# Patient Record
Sex: Male | Born: 1970 | Race: Black or African American | Hispanic: No | Marital: Married | Smoking: Never smoker
Health system: Southern US, Community
[De-identification: ages and names within clinical notes are randomized; demographics above are authoritative.]

## PROBLEM LIST (undated history)

## (undated) DIAGNOSIS — M109 Gout, unspecified: Secondary | ICD-10-CM

## (undated) DIAGNOSIS — I10 Essential (primary) hypertension: Secondary | ICD-10-CM

## (undated) DIAGNOSIS — E119 Type 2 diabetes mellitus without complications: Secondary | ICD-10-CM

## (undated) DIAGNOSIS — G4730 Sleep apnea, unspecified: Secondary | ICD-10-CM

---

## 2002-10-05 ENCOUNTER — Emergency Department (HOSPITAL_COMMUNITY): Admission: AC | Admit: 2002-10-05 | Discharge: 2002-10-06 | Payer: Self-pay

## 2002-10-05 ENCOUNTER — Encounter: Payer: Self-pay | Admitting: Emergency Medicine

## 2010-07-17 ENCOUNTER — Emergency Department (HOSPITAL_COMMUNITY)
Admission: EM | Admit: 2010-07-17 | Discharge: 2010-07-17 | Disposition: A | Discharge status: Home / Self Care | Payer: No Typology Code available for payment source | Attending: Emergency Medicine | Admitting: Emergency Medicine

## 2010-07-17 ENCOUNTER — Emergency Department (HOSPITAL_COMMUNITY): Payer: No Typology Code available for payment source

## 2010-07-17 DIAGNOSIS — IMO0002 Reserved for concepts with insufficient information to code with codable children: Secondary | ICD-10-CM | POA: Insufficient documentation

## 2015-07-06 ENCOUNTER — Emergency Department (HOSPITAL_COMMUNITY)
Admission: EM | Admit: 2015-07-06 | Discharge: 2015-07-06 | Disposition: A | Discharge status: Home / Self Care | Payer: BLUE CROSS/BLUE SHIELD | Attending: Emergency Medicine | Admitting: Emergency Medicine

## 2015-07-06 ENCOUNTER — Emergency Department (HOSPITAL_COMMUNITY): Payer: BLUE CROSS/BLUE SHIELD

## 2015-07-06 ENCOUNTER — Encounter (HOSPITAL_COMMUNITY): Payer: Self-pay | Admitting: Emergency Medicine

## 2015-07-06 DIAGNOSIS — R0789 Other chest pain: Secondary | ICD-10-CM

## 2015-07-06 DIAGNOSIS — R072 Precordial pain: Secondary | ICD-10-CM | POA: Diagnosis present

## 2015-07-06 DIAGNOSIS — Z79899 Other long term (current) drug therapy: Secondary | ICD-10-CM | POA: Diagnosis not present

## 2015-07-06 DIAGNOSIS — I10 Essential (primary) hypertension: Secondary | ICD-10-CM | POA: Diagnosis not present

## 2015-07-06 HISTORY — DX: Essential (primary) hypertension: I10

## 2015-07-06 LAB — CBC
HCT: 41.3 % (ref 39.0–52.0)
Hemoglobin: 14 g/dL (ref 13.0–17.0)
MCH: 27.8 pg (ref 26.0–34.0)
MCHC: 33.9 g/dL (ref 30.0–36.0)
MCV: 82.1 fL (ref 78.0–100.0)
Platelets: 276 K/uL (ref 150–400)
RBC: 5.03 MIL/uL (ref 4.22–5.81)
RDW: 13.6 % (ref 11.5–15.5)
WBC: 6 K/uL (ref 4.0–10.5)

## 2015-07-06 LAB — BASIC METABOLIC PANEL WITH GFR
Anion gap: 9 (ref 5–15)
BUN: 8 mg/dL (ref 6–20)
CO2: 24 mmol/L (ref 22–32)
Calcium: 9.1 mg/dL (ref 8.9–10.3)
Chloride: 104 mmol/L (ref 101–111)
Creatinine, Ser: 0.95 mg/dL (ref 0.61–1.24)
GFR calc Af Amer: >60 mL/min
GFR calc non Af Amer: >60 mL/min
Glucose, Bld: 108 mg/dL — ABNORMAL HIGH (ref 65–99)
Potassium: 3.9 mmol/L (ref 3.5–5.1)
Sodium: 137 mmol/L (ref 135–145)

## 2015-07-06 LAB — TROPONIN I: Troponin I: <0.03 ng/mL (ref <0.031)

## 2015-07-06 MED ORDER — NAPROXEN 500 MG PO TABS: 500.0000 mg | ORAL_TABLET | Freq: Two times a day (BID) | ORAL | Status: DC

## 2015-07-06 NOTE — ED Provider Notes (Signed)
CSN: [id number]     Arrival date & time 07/06/15  1039 History   First MD Initiated Contact with Patient 07/06/15 1046     Chief Complaint  Patient presents with  . Chest Pain     (Consider location/radiation/quality/duration/timing/severity/associated sxs/prior Treatment) The history is provided by the patient, the spouse and a parent.   Vallarie MareJoe A Fadden is a 45 y.o. male presenting for evaluation of midsternal and right sided chest pain which started suddenly 2 days ago when washing his car.  He describes sudden onset of a sharp, burning type pain and felt a tearing sensation as he was using his right hand to rub the car down.  His pain is triggered by movement but he also endorses 1/10 discomfort at rest.  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  He denies sob, dizziness, abdominal pain, nausea, vomiting, diaphoresis or palpitations during this time.  He is treated for htn, denies other risk factors for coronary disease including cholesterol issues (last checked at a work screening 2-3 years ago), no signficant family history, does not smoke.  He has taken no medicines for this problem prior to arrival.     Past Medical History  Diagnosis Date  . Hypertension    History reviewed. No pertinent past surgical history. History reviewed. No pertinent family history. Social History  Substance Use Topics  . Smoking status: Never Smoker   . Smokeless tobacco: None  . Alcohol Use: No    Review of Systems  Constitutional: Negative for fever and diaphoresis.  HENT: Negative for congestion and sore throat.   Eyes: Negative.   Respiratory: Negative for chest tightness, shortness of breath and wheezing.   Cardiovascular: Positive for chest pain.  Gastrointestinal: Negative for nausea, vomiting and abdominal pain.  Genitourinary: Negative.   Musculoskeletal: Negative for joint swelling, arthralgias and neck pain.    Skin: Negative.  Negative for rash and wound.  Neurological: Negative for dizziness, weakness, light-headedness, numbness and headaches.  Psychiatric/Behavioral: Negative.       Allergies  Review of patient's allergies indicates no known allergies.  Home Medications   Prior to Admission medications   Medication Sig Start Date End Date Taking? Authorizing Provider  amLODipine-olmesartan (AZOR) 10-40 MG tablet Take 1 tablet by mouth daily. 06/19/15  Yes Historical Provider, MD  cetirizine (ZYRTEC) 10 MG tablet Take 10 mg by mouth daily.   Yes Historical Provider, MD  naproxen (NAPROSYN) 500 MG tablet Take 1 tablet (500 mg total) by mouth 2 (two) times daily. 07/06/15   Burgess AmorJulie Eshaal Duby, PA-C   BP 127/81 mmHg  Pulse 74  Temp(Src) 97.9 F (36.6 C) (Oral)  Resp 21  Ht 5\' 11"  (1.803 m)  Wt 113.399 kg  BMI 34.88 kg/m2  SpO2 96% Physical Exam  Constitutional: He appears well-developed and well-nourished.  HENT:  Head: Normocephalic and atraumatic.  Eyes: Conjunctivae are normal.  Neck: Normal range of motion.  Cardiovascular: Normal rate, regular rhythm, normal heart sounds and intact distal pulses.   Pulmonary/Chest: Effort normal and breath sounds normal. He has no wheezes.    Reproducible pain right of sternum to mid right pectoralis. No palpable deformity, no edema or bruising.   Abdominal: Soft. Bowel sounds are normal. There is no tenderness.  Musculoskeletal: Normal range of motion. He exhibits no edema.  Neurological: He is alert.  Skin: Skin is warm and dry.  Psychiatric: He has a normal mood and affect.  Nursing note and vitals reviewed.   ED Course  Procedures (including critical care time) Labs Review Labs Reviewed  BASIC METABOLIC PANEL - Abnormal; Notable for the following:    Glucose, Bld 108 (*)    All other components within normal limits  CBC  TROPONIN I    Imaging Review Dg Chest 2 View  07/06/2015  CLINICAL DATA:  45 year old male with chest and right  arm pain for several days. Initial encounter. EXAM: CHEST  2 VIEW COMPARISON:  None available. FINDINGS: Low normal lung volumes. Normal cardiac size and mediastinal contours. Visualized tracheal air column is within normal limits. The lungs are clear. No pneumothorax or pleural effusion. No acute osseous abnormality identified. IMPRESSION: Negative, no acute cardiopulmonary abnormality. Electronically Signed   By: Odessa Fleming M.D.   On: 07/06/2015 13:11   I have personally reviewed and evaluated these images and lab results as part of my medical decision-making.   EKG Interpretation   Date/Time:  Tuesday July 06 2015 10:51:07 EDT Ventricular Rate:  88 PR Interval:  170 QRS Duration: 96 QT Interval:  337 QTC Calculation: 408 R Axis:   4 Text Interpretation:  Sinus rhythm Abnormal R-wave progression, early  transition Artifact No old tracing to compare Confirmed by [facility]  MD,  Nicholos Johns [phone]) on 07/06/2015 10:56:36 AM      MDM   Final diagnoses:  Acute chest wall pain    Right reproducible chest pain while washing car, persistent but improved pain x 2 days,  Ekg, troponin negative today.  Exam c/w musculoskeletal source.  Perc negative.  Wells for PE negative.  No c/o sob.  Naproxen, heat tx, f/u with pcp in one week if sx persist.  The patient appears reasonably screened and/or stabilized for discharge and I doubt any other medical condition or other [facility] requiring further screening, evaluation, or treatment in the ED at this time prior to discharge. Pt discussed with Dr Clarene Duke prior to dc home.    Burgess Amor, PA-C 07/06/15 1440  Samuel Jester, DO 07/09/15 [phone]

## 2015-07-06 NOTE — ED Notes (Signed)
PT states right sided chest pain radiating into right shoulder while washing his car x2 days with pain better just aching today. PT stated was sent to ED by urgent care for eval. PT denies any SOB and does HTN meds.

## 2015-07-06 NOTE — Discharge Instructions (Signed)

## 2016-09-08 IMAGING — DX DG CHEST 2V
2 series · 2 of 2 positions shown · non-contrast
Comparison: None available.

CLINICAL DATA: 44-year-old male with chest and right arm pain for
several days. Initial encounter.

EXAM:
CHEST  2 VIEW

[chest pa]
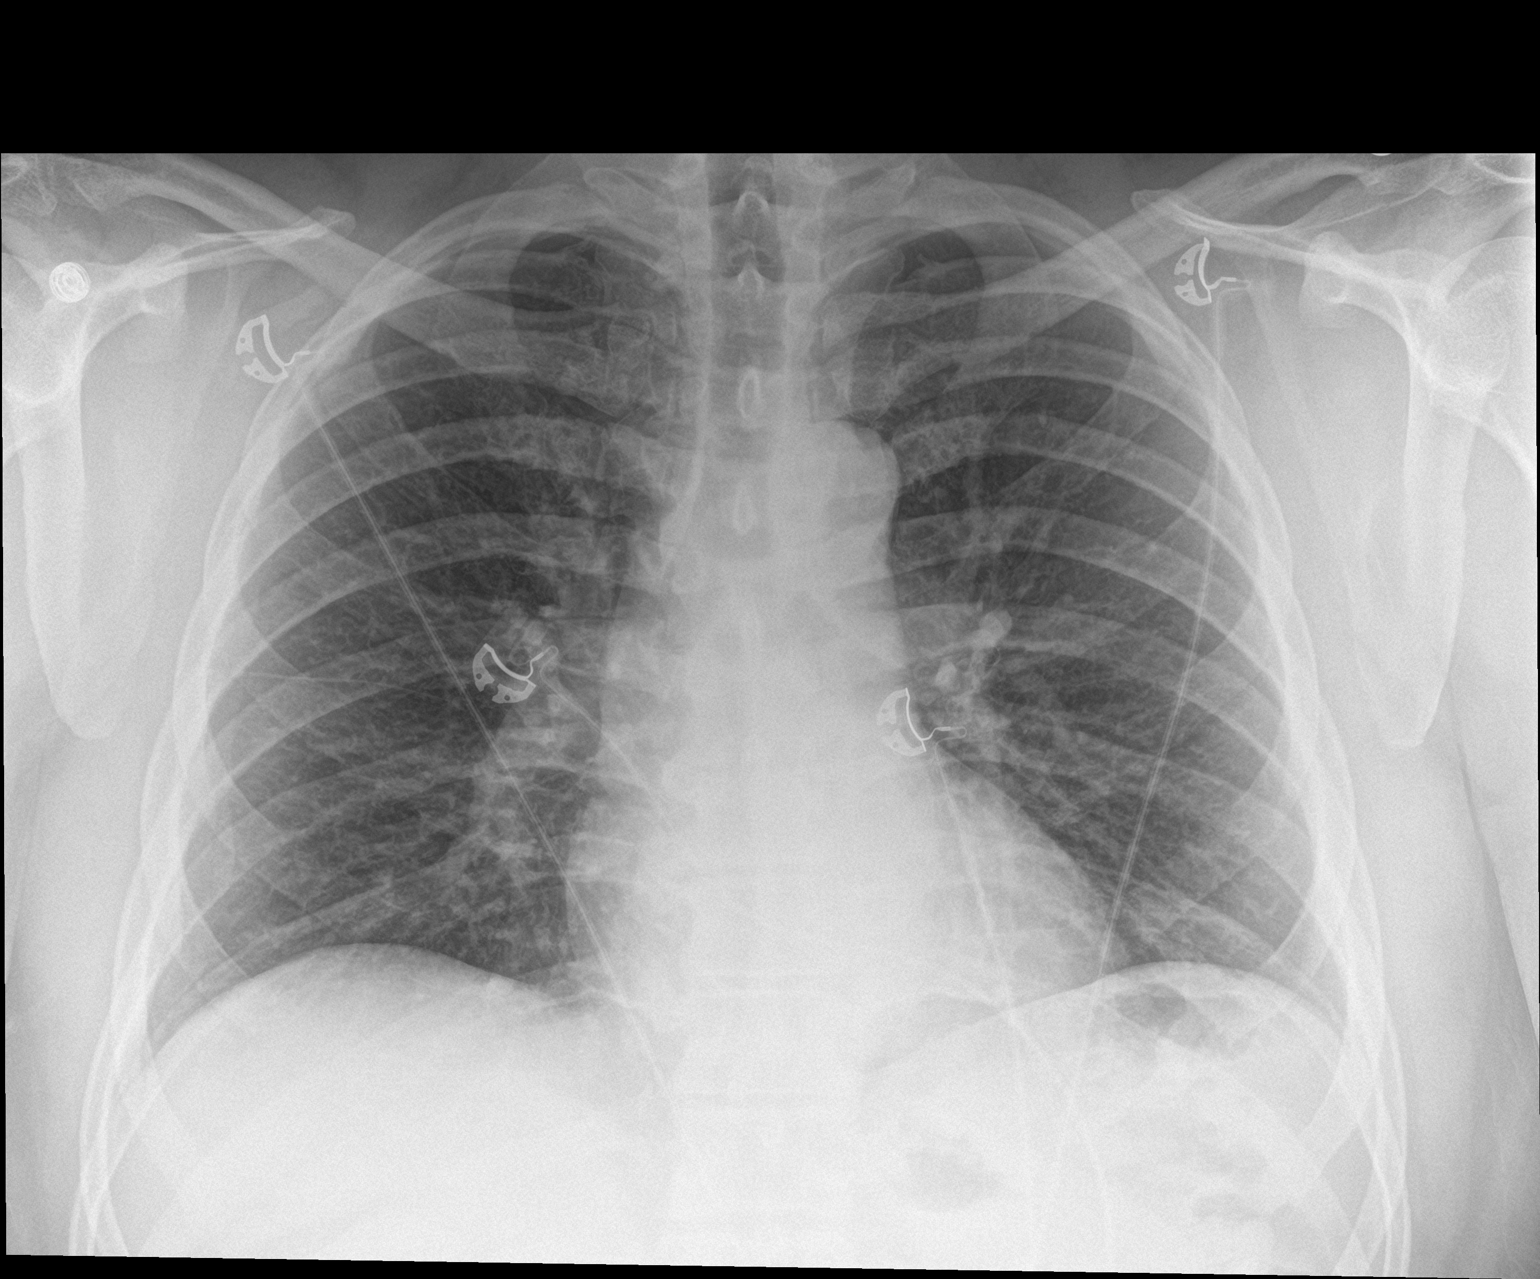

[chest lat]
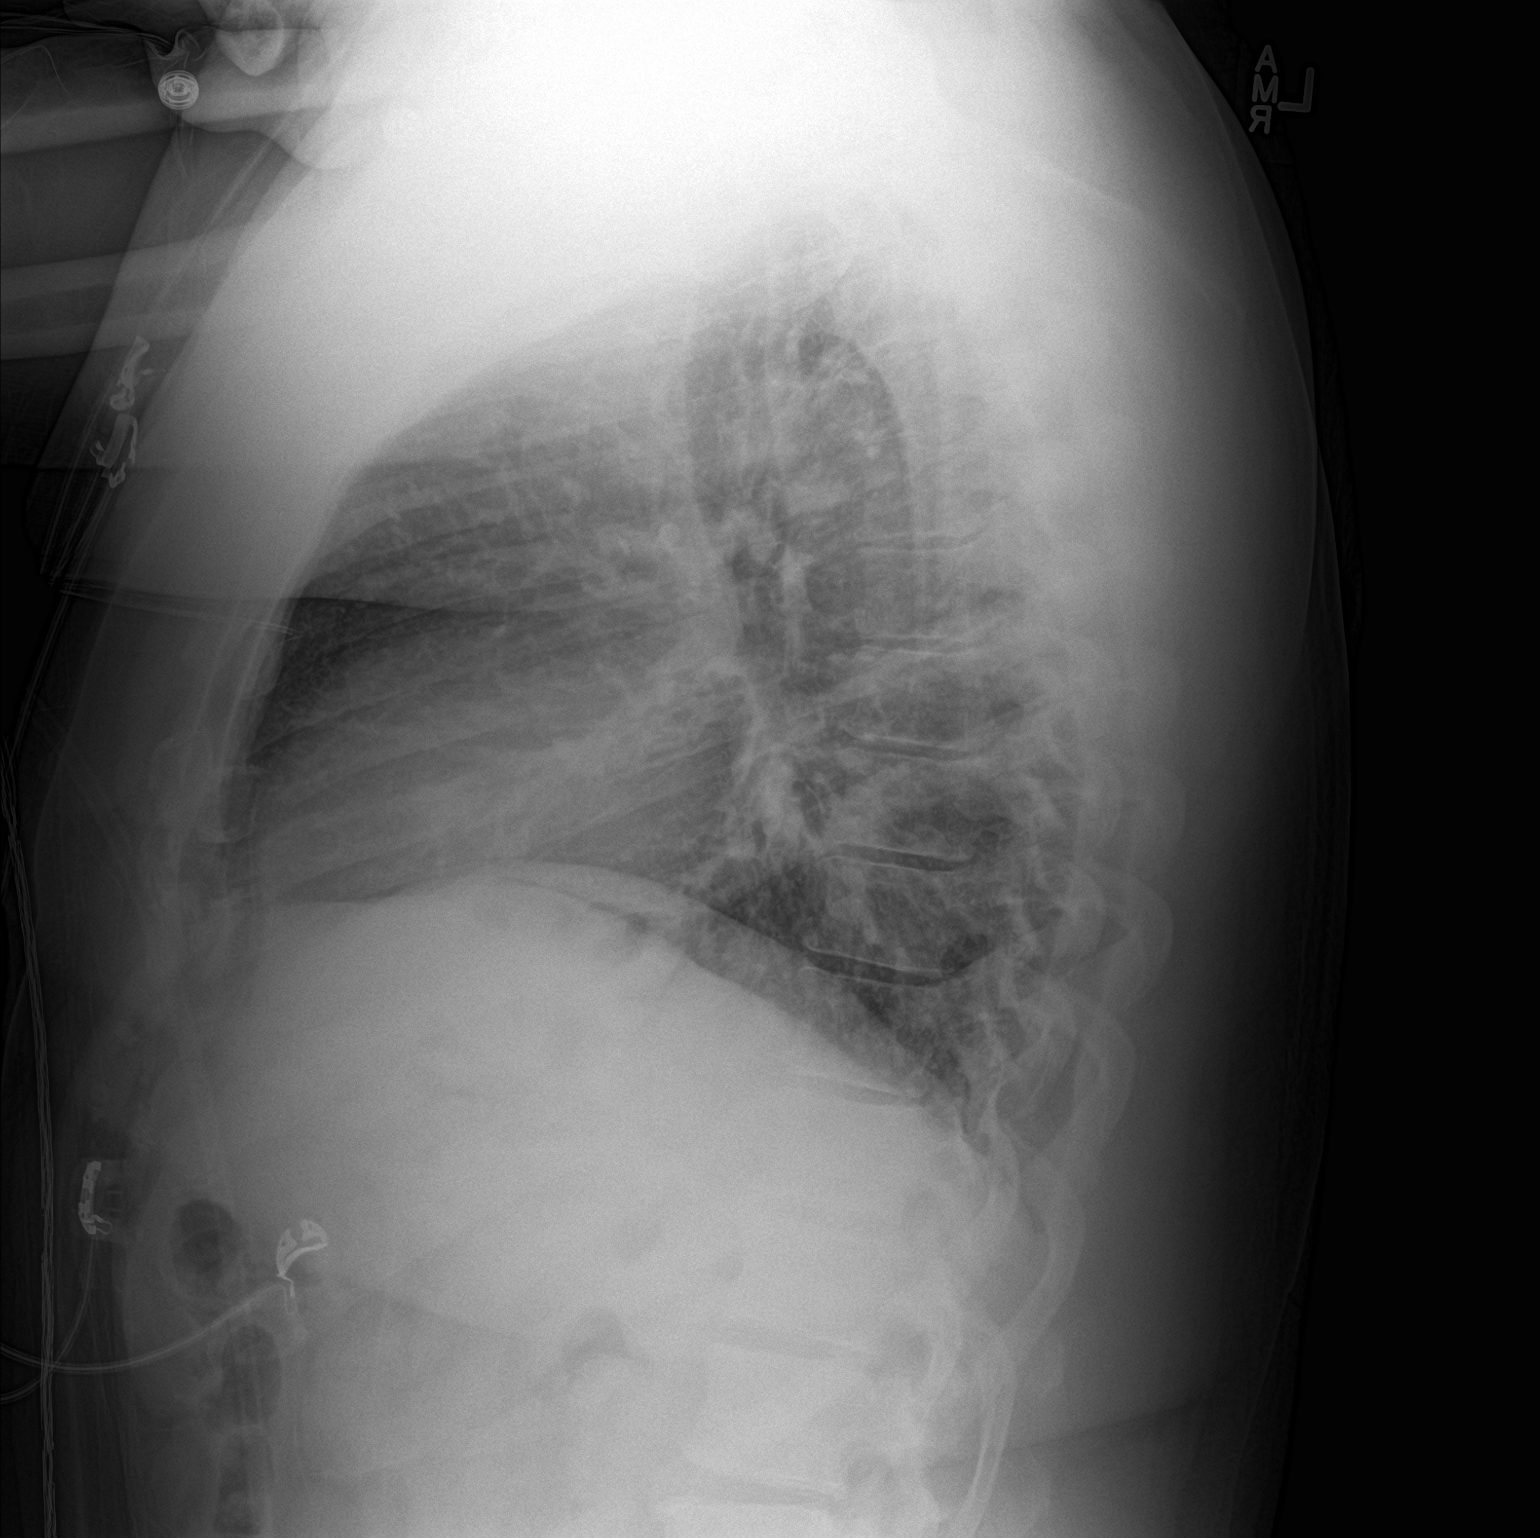

[2 of 2 positions shown; findings below may reference images not displayed]

FINDINGS: Low normal lung volumes. Normal cardiac size and mediastinal
contours. Visualized tracheal air column is within normal limits.
The lungs are clear. No pneumothorax or pleural effusion. No acute
osseous abnormality identified.
IMPRESSION: Negative, no acute cardiopulmonary abnormality.

## 2017-01-09 ENCOUNTER — Emergency Department (HOSPITAL_COMMUNITY): Payer: BLUE CROSS/BLUE SHIELD

## 2017-01-09 ENCOUNTER — Inpatient Hospital Stay (HOSPITAL_COMMUNITY)
Admission: EM | Admit: 2017-01-09 | Discharge: 2017-01-13 | DRG: 418 | Disposition: A | Discharge status: Home / Self Care | Payer: BLUE CROSS/BLUE SHIELD | Attending: General Surgery | Admitting: General Surgery

## 2017-01-09 ENCOUNTER — Encounter (HOSPITAL_COMMUNITY): Payer: Self-pay | Admitting: *Deleted

## 2017-01-09 DIAGNOSIS — I10 Essential (primary) hypertension: Secondary | ICD-10-CM | POA: Diagnosis present

## 2017-01-09 DIAGNOSIS — K819 Cholecystitis, unspecified: Secondary | ICD-10-CM

## 2017-01-09 DIAGNOSIS — D72829 Elevated white blood cell count, unspecified: Secondary | ICD-10-CM | POA: Diagnosis present

## 2017-01-09 DIAGNOSIS — K8020 Calculus of gallbladder without cholecystitis without obstruction: Secondary | ICD-10-CM | POA: Diagnosis present

## 2017-01-09 DIAGNOSIS — R739 Hyperglycemia, unspecified: Secondary | ICD-10-CM | POA: Diagnosis present

## 2017-01-09 DIAGNOSIS — K8000 Calculus of gallbladder with acute cholecystitis without obstruction: Secondary | ICD-10-CM | POA: Diagnosis present

## 2017-01-09 DIAGNOSIS — Z79899 Other long term (current) drug therapy: Secondary | ICD-10-CM | POA: Diagnosis not present

## 2017-01-09 DIAGNOSIS — E860 Dehydration: Secondary | ICD-10-CM | POA: Diagnosis present

## 2017-01-09 DIAGNOSIS — E871 Hypo-osmolality and hyponatremia: Secondary | ICD-10-CM | POA: Diagnosis present

## 2017-01-09 DIAGNOSIS — M109 Gout, unspecified: Secondary | ICD-10-CM | POA: Diagnosis present

## 2017-01-09 LAB — COMPREHENSIVE METABOLIC PANEL
ALT: 58 U/L (ref 17–63)
AST: 37 U/L (ref 15–41)
Albumin: 4.4 g/dL (ref 3.5–5.0)
Alkaline Phosphatase: 67 U/L (ref 38–126)
Anion gap: 9 (ref 5–15)
BUN: 7 mg/dL (ref 6–20)
CO2: 26 mmol/L (ref 22–32)
Calcium: 9.5 mg/dL (ref 8.9–10.3)
Chloride: 95 mmol/L — ABNORMAL LOW (ref 101–111)
Creatinine, Ser: 1.01 mg/dL (ref 0.61–1.24)
GFR calc Af Amer: >60 mL/min (ref >60)
GFR calc non Af Amer: >60 mL/min (ref >60)
Glucose, Bld: 195 mg/dL — ABNORMAL HIGH (ref 65–99)
Potassium: 3.6 mmol/L (ref 3.5–5.1)
Sodium: 130 mmol/L — ABNORMAL LOW (ref 135–145)
Total Bilirubin: 1.5 mg/dL — ABNORMAL HIGH (ref 0.3–1.2)
Total Protein: 8.9 g/dL — ABNORMAL HIGH (ref 6.5–8.1)

## 2017-01-09 LAB — CBC
HCT: 41.7 % (ref 39.0–52.0)
Hemoglobin: 14.6 g/dL (ref 13.0–17.0)
MCH: 29.1 pg (ref 26.0–34.0)
MCHC: 35 g/dL (ref 30.0–36.0)
MCV: 83.2 fL (ref 78.0–100.0)
Platelets: 313 10*3/uL (ref 150–400)
RBC: 5.01 MIL/uL (ref 4.22–5.81)
RDW: 12.9 % (ref 11.5–15.5)
WBC: 11 10*3/uL — ABNORMAL HIGH (ref 4.0–10.5)

## 2017-01-09 LAB — LIPASE, BLOOD: Lipase: 19 U/L (ref 11–51)

## 2017-01-09 LAB — URINALYSIS, ROUTINE W REFLEX MICROSCOPIC
Bilirubin Urine: NEGATIVE
Glucose, UA: 50 mg/dL — AB
Hgb urine dipstick: NEGATIVE
Ketones, ur: NEGATIVE mg/dL
Leukocytes, UA: NEGATIVE
Nitrite: NEGATIVE
Protein, ur: 100 mg/dL — AB
Specific Gravity, Urine: 1.016 (ref 1.005–1.030)
pH: 5 (ref 5.0–8.0)

## 2017-01-09 LAB — HEMOGLOBIN A1C
Hgb A1c MFr Bld: 6.8 % — ABNORMAL HIGH (ref 4.8–5.6)
MEAN PLASMA GLUCOSE: 148.46 mg/dL

## 2017-01-09 LAB — SURGICAL PCR SCREEN
MRSA, PCR: NEGATIVE
STAPHYLOCOCCUS AUREUS: NEGATIVE

## 2017-01-09 MED ORDER — SODIUM CHLORIDE 0.9 % IV SOLN
INTRAVENOUS | Status: DC
  Administered 2017-01-10: 02:00:00 via INTRAVENOUS

## 2017-01-09 MED ORDER — ONDANSETRON HCL 4 MG PO TABS: 4.0000 mg | ORAL_TABLET | Freq: Four times a day (QID) | ORAL | Status: DC | PRN

## 2017-01-09 MED ORDER — ONDANSETRON HCL 4 MG/2ML IJ SOLN: 4.0000 mg | Freq: Four times a day (QID) | INTRAMUSCULAR | Status: DC | PRN

## 2017-01-09 MED ORDER — BISACODYL 10 MG RE SUPP: 10.0000 mg | Freq: Every day | RECTAL | Status: DC | PRN

## 2017-01-09 MED ORDER — SODIUM CHLORIDE 0.9 % IV BOLUS (SEPSIS)
1000.0000 mL | Freq: Once | INTRAVENOUS | Status: AC
  Administered 2017-01-09: 1000 mL via INTRAVENOUS

## 2017-01-09 MED ORDER — DEXTROSE 5 % IV SOLN
2.0000 g | INTRAVENOUS | Status: DC
  Administered 2017-01-10: 2 g via INTRAVENOUS
  Filled 2017-01-09 (×2): qty 2

## 2017-01-09 MED ORDER — CHLORHEXIDINE GLUCONATE CLOTH 2 % EX PADS
6.0000 | MEDICATED_PAD | Freq: Once | CUTANEOUS | Status: AC
  Administered 2017-01-10: 6 via TOPICAL

## 2017-01-09 MED ORDER — MORPHINE SULFATE (PF) 2 MG/ML IV SOLN
1.0000 mg | INTRAVENOUS | Status: DC | PRN
  Administered 2017-01-09: 2 mg via INTRAVENOUS
  Filled 2017-01-09: qty 1

## 2017-01-09 MED ORDER — CHLORHEXIDINE GLUCONATE CLOTH 2 % EX PADS
6.0000 | MEDICATED_PAD | Freq: Once | CUTANEOUS | Status: AC
  Administered 2017-01-09: 6 via TOPICAL

## 2017-01-09 MED ORDER — PROMETHAZINE HCL 25 MG/ML IJ SOLN
12.5000 mg | Freq: Once | INTRAMUSCULAR | Status: AC
  Administered 2017-01-09: 12.5 mg via INTRAVENOUS
  Filled 2017-01-09: qty 1

## 2017-01-09 MED ORDER — HYDROMORPHONE HCL 1 MG/ML IJ SOLN
1.0000 mg | Freq: Once | INTRAMUSCULAR | Status: AC
  Administered 2017-01-09: 1 mg via INTRAVENOUS
  Filled 2017-01-09: qty 1

## 2017-01-09 MED ORDER — FAMOTIDINE IN NACL 20-0.9 MG/50ML-% IV SOLN
20.0000 mg | Freq: Once | INTRAVENOUS | Status: AC
  Administered 2017-01-09: 20 mg via INTRAVENOUS
  Filled 2017-01-09: qty 50

## 2017-01-09 MED ORDER — DEXTROSE 5 % IV SOLN
2.0000 g | Freq: Once | INTRAVENOUS | Status: AC
  Administered 2017-01-09: 2 g via INTRAVENOUS
  Filled 2017-01-09: qty 2

## 2017-01-09 NOTE — H&P (Signed)
History and Physical  Marcus Mathis:[id number] DOB: 1970/04/16 DOA: 01/09/2017  Referring physician: Juleen China PCP: Care, Caryn Section II   Chief Complaint: abdominal pain   HPI: Marcus Mathis is a 46 y.o. male with hypertension began developing abdominal pain across the right upper abdomen at midnight. He has not been able to sleep at cousin of the pain. It has been constant but more aggravated with certain movements. The patient had nausea and vomited once. He denies fever and chills. He had a large bowel movement earlier in the day. He tried to take an enema because he felt that he may have been constipated. No improvement in symptoms.  He reports that he has never experienced symptoms quite like this before. He denies significant alcohol consumption.  ED course: The patient was evaluated in the emergency department and noted to have a positive Murphy sign. He had a abdominal ultrasound that revealed cholelithiasis and sonographic findings consistent with acute cholecystitis. Dr. Lovell Sheehan with Gen. Surgery was consulted who will plan to see the patient the morning. He is being admitted by the medical service at this time.   Review of Systems: All systems reviewed and apart from history of presenting illness, are negative.  Past Medical History:  Diagnosis Date  . Hypertension    History reviewed. No pertinent surgical history. Social History:  reports that he has never smoked. He has never used smokeless tobacco. He reports that he does not drink alcohol or use drugs.  No Known Allergies  History reviewed. No pertinent family history.  Prior to Admission medications   Medication Sig Start Date End Date Taking? Authorizing Provider  amLODipine-olmesartan (AZOR) 10-40 MG tablet Take 1 tablet by mouth daily. 06/19/15  Yes [provider]  colchicine 0.6 MG tablet Take 0.6 mg by mouth daily.   Yes [provider]  cetirizine (ZYRTEC) 10 MG tablet Take 10 mg by mouth  daily.    [provider]   Physical Exam: Vitals:   01/09/17 1158 01/09/17 1530 01/09/17 1531 01/09/17 1543  BP: (!) 152/85 (!) 141/83    Pulse: 70  70 84  Resp: 20     Temp: 98.3 F (36.8 C)     TempSrc: Oral     SpO2: 97%  94% 96%  Weight: 128.4 kg (283 lb)     Height:  (1.803 m)        General exam: Moderately built and nourished patient, lying comfortably supine on the gurney in no obvious distress.  Head, eyes and ENT: Nontraumatic and normocephalic. Pupils equally reacting to light and accommodation. Oral mucosa dry.  Neck: Supple. No JVD, carotid bruit or thyromegaly.  Lymphatics: No lymphadenopathy.  Respiratory system: Clear to auscultation. No increased work of breathing.  Cardiovascular system: S1 and S2 heard, RRR. No JVD, murmurs, gallops, clicks or pedal edema.  Gastrointestinal system: Abdomen is nondistended, soft and RUQ and epigastric TTP positive [employer]. Normal bowel sounds heard. No organomegaly or masses appreciated.  Central nervous system: Alert and oriented. No focal neurological deficits.  Extremities: Symmetric 5 x 5 power. Peripheral pulses symmetrically felt.   Skin: No rashes or acute findings.  Musculoskeletal system: Negative exam.  Psychiatry: Pleasant and cooperative.  Labs on Admission:  Basic Metabolic Panel:  Recent Labs Lab 01/09/17 1200  NA 130*  K 3.6  CL 95*  CO2 26  GLUCOSE 195*  BUN 7  CREATININE 1.01  CALCIUM 9.5   Liver Function Tests:  Recent  Labs Lab 01/09/17 1200  AST 37  ALT 58  ALKPHOS 67  BILITOT 1.5*  PROT 8.9*  ALBUMIN 4.4    Recent Labs Lab 01/09/17 1200  LIPASE 19   No results for input(s): AMMONIA in the last 168 hours. CBC:  Recent Labs Lab 01/09/17 1200  WBC 11.0*  HGB 14.6  HCT 41.7  MCV 83.2  PLT 313   Cardiac Enzymes: No results for input(s): CKTOTAL, CKMB, CKMBINDEX, TROPONINI in the last 168 hours.  BNP (last 3 results) No results for input(s):  PROBNP in the last 8760 hours. CBG: No results for input(s): GLUCAP in the last 168 hours.  Radiological Exams on Admission: US Abdomen Limited  Result Date: 01/09/2017 CLINICAL DATA:  Right upper quadrant pain with nausea and vomiting. EXAM: ULTRASOUND ABDOMEN LIMITED RIGHT UPPER QUADRANT COMPARISON:  None. FINDINGS: Gallbladder: Gallstones are evident, measuring up to 2.8 cm. There is gallbladder wall thickening with apparent gallbladder wall edema. Trace amount of pericholecystic fluid evident. Sonographer reports no sonographic Murphy sign. Common bile duct: Diameter: 4-5 mm Liver: Diffusely increased echogenicity suggests fatty deposition. Portal vein is patent on color Doppler imaging with normal direction of blood flow towards the liver. IMPRESSION: 1. Cholelithiasis with gallbladder wall edema/thickening and trace pericholecystic fluid. The sonographic imaging features are highly suspicious for acute cholecystitis. 2. No biliary dilatation. Electronically Signed   By: Kennith Center M.D.   On: 01/09/2017 14:00    EKG: Independently reviewed. Normal Sinus Rhythm  Assessment/Plan Principal Problem:   Acute calculous cholecystitis Active Problems:   Hyponatremia   Leukocytosis   Hyperglycemia   Hypertension   Cholelithiasis   Dehydration   Hyperbilirubinemia   Gout  1. Acute calculus cholecystitis-admit to a MedSurg bed, keep NPO, IV fluid hydration for supportive care, IV nausea and pain medication. Gen. Surgery consult to Dr. Lovell Sheehan who will see the patient in the morning.  IV ceftriaxone ordered.  Check a preop EKG.  2. Hyponatremia - likely secondary to dehydration associated with mild hyperglycemia, treating with IV fluid hydration, recheck in the morning. 3. Hyperbilirubinemia secondary to cholelithiasis-repeat testing in the morning. 4. Leukocytosis-reactive and secondary to cholecystitis-IV antibiotics ordered, repeat CBC in the morning. 5. Dehydration - IV fluid hydration  ordered. 6. Hyperglycemia-possibly reactive. Denies history of diabetes. Check a hemoglobin A1c as a screening test.  I did see glucose in the urine, he may have undiagnosed type 2 diabetes. He has multiple risk factors.  Fasting lipid panel in AM ordered as well as 25-OH Vit D.   DVT Prophylaxis: SCDs Code Status: Full   Family Communication: bedside  Disposition Plan: Home in 2-3 days   Time spent: 54 mins  Standley Dakins, MD [facility] Pager [phone]  If 7PM-7AM, please contact night-coverage [email or web address] Password [email or web address] 01/09/2017, 4:22 PM

## 2017-01-09 NOTE — ED Triage Notes (Signed)
Pt c/o abdominal pain, nausea, vomiting x 1, constipation since last night. Denies fever. Pt has used OTC laxative and suppository with small BM today. Last normal BM yesterday.

## 2017-01-09 NOTE — ED Provider Notes (Signed)
[facility] EMERGENCY DEPARTMENT Provider Note   CSN: [id number] Arrival date & time: 01/09/17  1133     History   Chief Complaint Chief Complaint  Patient presents with  . Abdominal Pain    HPI Marcus Mathis is a 46 y.o. male.  HPI   46 year old male with abdominal pain. Onset last night around midnight. Pain is diffuse but worse across upper abdomen. Constant since onset. " It just hurts." Initially had nausea and vomited once. Nausea has resolved since then. No urinary complaints. No fevers or chills. Thought he may be constipated despite having a large bowel movement earlier in the day. He did try taking an enema and did pass a small amount of stool without any improvement. No fevers or chills. No past abdominal surgical history. No history similar symptoms. Denies significant alcohol use.  Past Medical History:  Diagnosis Date  . Hypertension     There are no active problems to display for this patient.   History reviewed. No pertinent surgical history.     Home Medications    Prior to Admission medications   Medication Sig Start Date End Date Taking? Authorizing Provider  amLODipine-olmesartan (AZOR) 10-40 MG tablet Take 1 tablet by mouth daily. 06/19/15   [provider]  cetirizine (ZYRTEC) 10 MG tablet Take 10 mg by mouth daily.    [provider]  naproxen (NAPROSYN) 500 MG tablet Take 1 tablet (500 mg total) by mouth 2 (two) times daily. 07/06/15   Burgess Amor, PA-C    Family History No family history on file.  Social History Social History  Substance Use Topics  . Smoking status: Never Smoker  . Smokeless tobacco: Never Used  . Alcohol use No     Allergies   Patient has no known allergies.   Review of Systems Review of Systems  All systems reviewed and negative, other than as noted in HPI.  Physical Exam Updated Vital Signs BP (!) 152/85   Pulse 70   Temp 98.3 F (36.8 C) (Oral)   Resp 20   Ht  (1.803 m)   Wt  128.4 kg (283 lb)   SpO2 97%   BMI 39.47 kg/m   Physical Exam  Constitutional: He appears well-developed and well-nourished. No distress.  HENT:  Head: Normocephalic and atraumatic.  Eyes: Conjunctivae are normal. Right eye exhibits no discharge. Left eye exhibits no discharge.  Neck: Neck supple.  Cardiovascular: Normal rate, regular rhythm and normal heart sounds.  Exam reveals no gallop and no friction rub.   No murmur heard. Pulmonary/Chest: Effort normal and breath sounds normal. No respiratory distress.  Abdominal: Soft. He exhibits no distension. There is tenderness.  Perhaps mild distension versus protuberant abdomen. RUQ and epigastric tenderness w/o rebound or guarding.   Musculoskeletal: He exhibits no edema or tenderness.  Neurological: He is alert.  Skin: Skin is warm and dry.  Psychiatric: He has a normal mood and affect. His behavior is normal. Thought content normal.  Nursing note and vitals reviewed.    ED Treatments / Results  Labs (all labs ordered are listed, but only abnormal results are displayed) Labs Reviewed  COMPREHENSIVE METABOLIC PANEL - Abnormal; Notable for the following:       Result Value   Sodium 130 (*)    Chloride 95 (*)    Glucose, Bld 195 (*)    Total Protein 8.9 (*)    Total Bilirubin 1.5 (*)    All other components within normal limits  CBC - Abnormal; Notable for the following:    WBC 11.0 (*)    All other components within normal limits  URINALYSIS, ROUTINE W REFLEX MICROSCOPIC - Abnormal; Notable for the following:    Glucose, UA 50 (*)    Protein, ur 100 (*)    Bacteria, UA RARE (*)    Squamous Epithelial / LPF 0-5 (*)    All other components within normal limits  LIPASE, BLOOD    EKG  EKG Interpretation None       Radiology US Abdomen Limited  Result Date: 01/09/2017 CLINICAL DATA:  Right upper quadrant pain with nausea and vomiting. EXAM: ULTRASOUND ABDOMEN LIMITED RIGHT UPPER QUADRANT COMPARISON:  None. FINDINGS:  Gallbladder: Gallstones are evident, measuring up to 2.8 cm. There is gallbladder wall thickening with apparent gallbladder wall edema. Trace amount of pericholecystic fluid evident. Sonographer reports no sonographic Murphy sign. Common bile duct: Diameter: 4-5 mm Liver: Diffusely increased echogenicity suggests fatty deposition. Portal vein is patent on color Doppler imaging with normal direction of blood flow towards the liver. IMPRESSION: 1. Cholelithiasis with gallbladder wall edema/thickening and trace pericholecystic fluid. The sonographic imaging features are highly suspicious for acute cholecystitis. 2. No biliary dilatation. Electronically Signed   By: Kennith Center M.D.   On: 01/09/2017 14:00    Procedures Procedures (including critical care time)  Medications Ordered in ED Medications  HYDROmorphone (DILAUDID) injection 1 mg (not administered)  promethazine (PHENERGAN) injection 12.5 mg (not administered)  sodium chloride 0.9 % bolus 1,000 mL (not administered)  famotidine (PEPCID) IVPB 20 mg premix (not administered)     Initial Impression / Assessment and Plan / ED Course  I have reviewed the triage vital signs and the nursing notes.  Pertinent labs & imaging results that were available during my care of the patient were reviewed by me and considered in my medical decision making (see chart for details).    12:43 PM 45yM with abdominal pain. Describes diffuse pain but actually only focally tender in RUQ and to lesser extent epigastrium. No peritoneal signs. Afebrile. Appears uncomfortable, but not toxic. Question symptomatic cholelithiasis. Labs pending. Symptomatic tx. RUQ [place]. Reassessment.   Ultrasound with multiple large gallstones, gallbladder wall thickening and mild pericholecystic fluid. LFTs are normal. Minimal elevation in white count. Afebrile. Discussed with Dr. Lovell Sheehan, general surgery. He will see in morning. Admission to hospitalist service. Ceftriaxone ordered. Pt  very sleepy after meds but pain improved. Pt/mother updated.   Final Clinical Impressions(s) / ED Diagnoses   Final diagnoses:  Cholecystitis    New Prescriptions New Prescriptions   No medications on file     Raeford Razor, MD 01/09/17 1502

## 2017-01-10 ENCOUNTER — Encounter (HOSPITAL_COMMUNITY): Payer: Self-pay | Admitting: *Deleted

## 2017-01-10 ENCOUNTER — Encounter (HOSPITAL_COMMUNITY)
Admission: EM | Disposition: A | Discharge status: Home / Self Care | Payer: Self-pay | Source: Home / Self Care | Attending: General Surgery

## 2017-01-10 ENCOUNTER — Inpatient Hospital Stay (HOSPITAL_COMMUNITY): Payer: BLUE CROSS/BLUE SHIELD | Admitting: Anesthesiology

## 2017-01-10 DIAGNOSIS — K8000 Calculus of gallbladder with acute cholecystitis without obstruction: Principal | ICD-10-CM

## 2017-01-10 HISTORY — PX: CHOLECYSTECTOMY: SHX55

## 2017-01-10 LAB — CBC
HCT: 40.5 % (ref 39.0–52.0)
HEMOGLOBIN: 13.9 g/dL (ref 13.0–17.0)
MCH: 28.6 pg (ref 26.0–34.0)
MCHC: 34.3 g/dL (ref 30.0–36.0)
MCV: 83.3 fL (ref 78.0–100.0)
Platelets: 321 10*3/uL (ref 150–400)
RBC: 4.86 MIL/uL (ref 4.22–5.81)
RDW: 13 % (ref 11.5–15.5)
WBC: 15.2 10*3/uL — AB (ref 4.0–10.5)

## 2017-01-10 LAB — COMPREHENSIVE METABOLIC PANEL
ALT: 42 U/L (ref 17–63)
ANION GAP: 9 (ref 5–15)
AST: 23 U/L (ref 15–41)
Albumin: 3.8 g/dL (ref 3.5–5.0)
Alkaline Phosphatase: 61 U/L (ref 38–126)
BILIRUBIN TOTAL: 2.5 mg/dL — AB (ref 0.3–1.2)
BUN: 6 mg/dL (ref 6–20)
CHLORIDE: 100 mmol/L — AB (ref 101–111)
CO2: 26 mmol/L (ref 22–32)
Calcium: 8.9 mg/dL (ref 8.9–10.3)
Creatinine, Ser: 0.92 mg/dL (ref 0.61–1.24)
Glucose, Bld: 138 mg/dL — ABNORMAL HIGH (ref 65–99)
POTASSIUM: 3.6 mmol/L (ref 3.5–5.1)
Sodium: 135 mmol/L (ref 135–145)
TOTAL PROTEIN: 8 g/dL (ref 6.5–8.1)

## 2017-01-10 LAB — LIPID PANEL
CHOL/HDL RATIO: 3.5 ratio
CHOLESTEROL: 141 mg/dL (ref 0–200)
HDL: 40 mg/dL — ABNORMAL LOW (ref >40)
LDL Cholesterol: 86 mg/dL (ref 0–99)
TRIGLYCERIDES: 74 mg/dL (ref <150)
VLDL: 15 mg/dL (ref 0–40)

## 2017-01-10 LAB — MAGNESIUM: MAGNESIUM: 2 mg/dL (ref 1.7–2.4)

## 2017-01-10 LAB — VITAMIN D 25 HYDROXY (VIT D DEFICIENCY, FRACTURES): VIT D 25 HYDROXY: 17.1 ng/mL — AB (ref 30.0–100.0)

## 2017-01-10 SURGERY — LAPAROSCOPIC CHOLECYSTECTOMY
Anesthesia: General | Site: Abdomen

## 2017-01-10 MED ORDER — CHLORHEXIDINE GLUCONATE CLOTH 2 % EX PADS: 6.0000 | MEDICATED_PAD | Freq: Once | CUTANEOUS | Status: DC

## 2017-01-10 MED ORDER — OXYCODONE-ACETAMINOPHEN 5-325 MG PO TABS
1.0000 | ORAL_TABLET | ORAL | Status: DC | PRN
  Administered 2017-01-12 (×2): 2 via ORAL
  Administered 2017-01-12: 1 via ORAL
  Administered 2017-01-13: 2 via ORAL
  Filled 2017-01-10 (×3): qty 2
  Filled 2017-01-10: qty 1
  Filled 2017-01-10: qty 2

## 2017-01-10 MED ORDER — MIDAZOLAM HCL 2 MG/2ML IJ SOLN
1.0000 mg | INTRAMUSCULAR | Status: AC
  Administered 2017-01-10: 2 mg via INTRAVENOUS
  Filled 2017-01-10: qty 2

## 2017-01-10 MED ORDER — AMLODIPINE-OLMESARTAN 10-40 MG PO TABS: 1.0000 | ORAL_TABLET | Freq: Every day | ORAL | Status: DC

## 2017-01-10 MED ORDER — HEMOSTATIC AGENTS (NO CHARGE) OPTIME
TOPICAL | Status: DC | PRN
  Administered 2017-01-10 (×2): 1 via TOPICAL

## 2017-01-10 MED ORDER — AMLODIPINE BESYLATE 5 MG PO TABS
10.0000 mg | ORAL_TABLET | Freq: Every day | ORAL | Status: DC
  Administered 2017-01-10 – 2017-01-13 (×4): 10 mg via ORAL
  Filled 2017-01-10 (×4): qty 2

## 2017-01-10 MED ORDER — BUPIVACAINE HCL (PF) 0.5 % IJ SOLN
INTRAMUSCULAR | Status: DC | PRN
  Administered 2017-01-10: 10 mL

## 2017-01-10 MED ORDER — POVIDONE-IODINE 10 % OINT PACKET
TOPICAL_OINTMENT | CUTANEOUS | Status: DC | PRN
  Administered 2017-01-10: 1 via TOPICAL

## 2017-01-10 MED ORDER — ACETAMINOPHEN 650 MG RE SUPP: 650.0000 mg | Freq: Four times a day (QID) | RECTAL | Status: DC | PRN

## 2017-01-10 MED ORDER — SUCCINYLCHOLINE CHLORIDE 20 MG/ML IJ SOLN
INTRAMUSCULAR | Status: DC | PRN
  Administered 2017-01-10: 140 mg via INTRAVENOUS

## 2017-01-10 MED ORDER — ACETAMINOPHEN 325 MG PO TABS: 650.0000 mg | ORAL_TABLET | Freq: Four times a day (QID) | ORAL | Status: DC | PRN

## 2017-01-10 MED ORDER — LACTATED RINGERS IV SOLN
INTRAVENOUS | Status: DC
  Administered 2017-01-11 – 2017-01-12 (×3): via INTRAVENOUS

## 2017-01-10 MED ORDER — CHLORHEXIDINE GLUCONATE CLOTH 2 % EX PADS
6.0000 | MEDICATED_PAD | Freq: Once | CUTANEOUS | Status: AC
  Administered 2017-01-10: 6 via TOPICAL

## 2017-01-10 MED ORDER — ONDANSETRON 4 MG PO TBDP: 4.0000 mg | ORAL_TABLET | Freq: Four times a day (QID) | ORAL | Status: DC | PRN

## 2017-01-10 MED ORDER — ONDANSETRON HCL 4 MG/2ML IJ SOLN: 4.0000 mg | Freq: Four times a day (QID) | INTRAMUSCULAR | Status: DC | PRN

## 2017-01-10 MED ORDER — GLYCOPYRROLATE 0.2 MG/ML IJ SOLN
INTRAMUSCULAR | Status: DC | PRN
  Administered 2017-01-10: 0.4 mg via INTRAVENOUS
  Administered 2017-01-10: 0.2 mg via INTRAVENOUS

## 2017-01-10 MED ORDER — BUPIVACAINE HCL (PF) 0.5 % IJ SOLN
INTRAMUSCULAR | Status: AC
  Filled 2017-01-10: qty 30

## 2017-01-10 MED ORDER — FENTANYL CITRATE (PF) 100 MCG/2ML IJ SOLN: 25.0000 ug | INTRAMUSCULAR | Status: DC | PRN

## 2017-01-10 MED ORDER — LACTATED RINGERS IV SOLN
INTRAVENOUS | Status: DC
  Administered 2017-01-10: 17:00:00 via INTRAVENOUS
  Administered 2017-01-10: 1000 mL via INTRAVENOUS

## 2017-01-10 MED ORDER — POVIDONE-IODINE 10 % EX OINT
TOPICAL_OINTMENT | CUTANEOUS | Status: AC
  Filled 2017-01-10: qty 1

## 2017-01-10 MED ORDER — PROPOFOL 10 MG/ML IV BOLUS
INTRAVENOUS | Status: DC | PRN
  Administered 2017-01-10: 50 mg via INTRAVENOUS
  Administered 2017-01-10: 150 mg via INTRAVENOUS
  Administered 2017-01-10: 30 mg via INTRAVENOUS
  Administered 2017-01-10: 20 mg via INTRAVENOUS

## 2017-01-10 MED ORDER — ONDANSETRON HCL 4 MG/2ML IJ SOLN
4.0000 mg | Freq: Once | INTRAMUSCULAR | Status: AC
  Administered 2017-01-10: 4 mg via INTRAVENOUS
  Filled 2017-01-10: qty 2

## 2017-01-10 MED ORDER — ROCURONIUM BROMIDE 100 MG/10ML IV SOLN
INTRAVENOUS | Status: DC | PRN
  Administered 2017-01-10: 35 mg via INTRAVENOUS
  Administered 2017-01-10: 10 mg via INTRAVENOUS
  Administered 2017-01-10: 5 mg via INTRAVENOUS

## 2017-01-10 MED ORDER — SODIUM CHLORIDE 0.9 % IR SOLN
Status: DC | PRN
  Administered 2017-01-10: 1000 mL
  Administered 2017-01-10: 3000 mL

## 2017-01-10 MED ORDER — FENTANYL CITRATE (PF) 250 MCG/5ML IJ SOLN
INTRAMUSCULAR | Status: AC
  Filled 2017-01-10: qty 5

## 2017-01-10 MED ORDER — FENTANYL CITRATE (PF) 100 MCG/2ML IJ SOLN
INTRAMUSCULAR | Status: DC | PRN
  Administered 2017-01-10: 100 ug via INTRAVENOUS
  Administered 2017-01-10: 50 ug via INTRAVENOUS

## 2017-01-10 MED ORDER — GLYCOPYRROLATE 0.2 MG/ML IJ SOLN
0.2000 mg | Freq: Once | INTRAMUSCULAR | Status: AC
  Administered 2017-01-10: 0.2 mg via INTRAVENOUS
  Filled 2017-01-10: qty 1

## 2017-01-10 MED ORDER — DEXTROSE 5 % IV SOLN
2.0000 g | INTRAVENOUS | Status: DC
  Administered 2017-01-11: 2 g via INTRAVENOUS
  Filled 2017-01-10: qty 2

## 2017-01-10 MED ORDER — KETOROLAC TROMETHAMINE 30 MG/ML IJ SOLN
30.0000 mg | Freq: Once | INTRAMUSCULAR | Status: AC
  Administered 2017-01-10: 30 mg via INTRAVENOUS
  Filled 2017-01-10: qty 1

## 2017-01-10 MED ORDER — COLCHICINE 0.6 MG PO TABS
0.6000 mg | ORAL_TABLET | Freq: Every day | ORAL | Status: DC
  Administered 2017-01-10 – 2017-01-13 (×4): 0.6 mg via ORAL
  Filled 2017-01-10 (×4): qty 1

## 2017-01-10 MED ORDER — NEOSTIGMINE METHYLSULFATE 10 MG/10ML IV SOLN
INTRAVENOUS | Status: DC | PRN
  Administered 2017-01-10: 3 mg via INTRAVENOUS

## 2017-01-10 MED ORDER — ENOXAPARIN SODIUM 40 MG/0.4ML ~~LOC~~ SOLN
40.0000 mg | SUBCUTANEOUS | Status: DC
  Administered 2017-01-11 – 2017-01-13 (×3): 40 mg via SUBCUTANEOUS
  Filled 2017-01-10 (×3): qty 0.4

## 2017-01-10 MED ORDER — HYDROMORPHONE HCL 1 MG/ML IJ SOLN
1.0000 mg | INTRAMUSCULAR | Status: DC | PRN
Start: 2017-01-10 — End: 2017-01-13
  Administered 2017-01-10 – 2017-01-12 (×6): 1 mg via INTRAVENOUS
  Filled 2017-01-10 (×6): qty 1

## 2017-01-10 MED ORDER — SIMETHICONE 80 MG PO CHEW: 40.0000 mg | CHEWABLE_TABLET | Freq: Four times a day (QID) | ORAL | Status: DC | PRN

## 2017-01-10 MED ORDER — IRBESARTAN 300 MG PO TABS
300.0000 mg | ORAL_TABLET | Freq: Every day | ORAL | Status: DC
  Administered 2017-01-10 – 2017-01-13 (×4): 300 mg via ORAL
  Filled 2017-01-10 (×4): qty 1

## 2017-01-10 SURGICAL SUPPLY — 43 items
APPLIER CLIP ROT 10 11.4 M/L (STAPLE) ×2
BAG HAMPER (MISCELLANEOUS) ×2 IMPLANT
BAG RETRIEVAL 10 (BASKET) ×1
CHLORAPREP W/TINT 26ML (MISCELLANEOUS) ×2 IMPLANT
CLIP APPLIE ROT 10 11.4 M/L (STAPLE) ×1 IMPLANT
CLOTH BEACON ORANGE TIMEOUT ST (SAFETY) ×2 IMPLANT
COVER LIGHT HANDLE STERIS (MISCELLANEOUS) ×4 IMPLANT
DECANTER SPIKE VIAL GLASS SM (MISCELLANEOUS) ×2 IMPLANT
ELECT REM PT RETURN 9FT ADLT (ELECTROSURGICAL) ×2
ELECTRODE REM PT RTRN 9FT ADLT (ELECTROSURGICAL) ×1 IMPLANT
FILTER SMOKE EVAC LAPAROSHD (FILTER) ×2 IMPLANT
FORMALIN 10 PREFIL 120ML (MISCELLANEOUS) ×2 IMPLANT
GLOVE BIOGEL PI IND STRL 7.0 (GLOVE) ×1 IMPLANT
GLOVE BIOGEL PI INDICATOR 7.0 (GLOVE) ×1
GLOVE SURG SS PI 7.5 STRL IVOR (GLOVE) ×2 IMPLANT
GOWN STRL REUS W/ TWL XL LVL3 (GOWN DISPOSABLE) ×1 IMPLANT
GOWN STRL REUS W/TWL LRG LVL3 (GOWN DISPOSABLE) ×4 IMPLANT
GOWN STRL REUS W/TWL XL LVL3 (GOWN DISPOSABLE) ×1
HEMOSTAT SNOW SURGICEL 2X4 (HEMOSTASIS) ×4 IMPLANT
INST SET LAPROSCOPIC AP (KITS) ×2 IMPLANT
IV NS IRRIG 3000ML ARTHROMATIC (IV SOLUTION) ×2 IMPLANT
KIT ROOM TURNOVER APOR (KITS) ×2 IMPLANT
MANIFOLD NEPTUNE II (INSTRUMENTS) ×2 IMPLANT
NEEDLE INSUFFLATION 14GA 120MM (NEEDLE) ×2 IMPLANT
NS IRRIG 1000ML POUR BTL (IV SOLUTION) ×2 IMPLANT
PACK LAP CHOLE LZT030E (CUSTOM PROCEDURE TRAY) ×2 IMPLANT
PAD ARMBOARD 7.5X6 YLW CONV (MISCELLANEOUS) ×2 IMPLANT
PENCIL HANDSWITCHING (ELECTRODE) ×2 IMPLANT
SET BASIN LINEN APH (SET/KITS/TRAYS/PACK) ×2 IMPLANT
SET TUBE IRRIG SUCTION NO TIP (IRRIGATION / IRRIGATOR) ×2 IMPLANT
SLEEVE ENDOPATH XCEL 5M (ENDOMECHANICALS) ×2 IMPLANT
SPONGE GAUZE 2X2 8PLY STRL LF (GAUZE/BANDAGES/DRESSINGS) ×2 IMPLANT
STAPLER VISISTAT (STAPLE) ×2 IMPLANT
SUT VICRYL 0 UR6 27IN ABS (SUTURE) ×4 IMPLANT
SYS BAG RETRIEVAL 10MM (BASKET) ×1
SYSTEM BAG RETRIEVAL 10MM (BASKET) ×1 IMPLANT
TAPE PAPER 3X10 WHT MICROPORE (GAUZE/BANDAGES/DRESSINGS) ×2 IMPLANT
TROCAR ENDO BLADELESS 11MM (ENDOMECHANICALS) ×2 IMPLANT
TROCAR XCEL NON-BLD 5MMX100MML (ENDOMECHANICALS) ×2 IMPLANT
TROCAR XCEL UNIV SLVE 11M 100M (ENDOMECHANICALS) ×2 IMPLANT
TUBE CONNECTING 12X1/4 (SUCTIONS) ×2 IMPLANT
TUBING INSUFFLATION (TUBING) ×2 IMPLANT
WARMER LAPAROSCOPE (MISCELLANEOUS) ×2 IMPLANT

## 2017-01-10 NOTE — Op Note (Signed)
Patient:  Marcus Mathis  DOB:  1970/09/21  MRN:  [id number]   Preop Diagnosis:  Acute cholecystitis, cholelithiasis  Postop Diagnosis:  same  Procedure:  Laparoscopic cholecystectomy  Surgeon:  Franky MachoMark Izaih Kataoka, M.D.  Asst.: Algis GreenhouseLindsay Bridges, M.D.  Anes:  General endotracheal  Indications:  Patient is a 46 year old black male who presents with acute cholecystitis secondary to cholelithiasis. The risks and benefits of the procedure including bleeding, infection, hepatobiliary injury, and the possibility of an open procedure were fully explained to the patient, who gave informed consent.  Procedure note:  The patient was placed in the supine position. After induction of general endotracheal anesthesia, the abdomen was prepped and draped using the usual sterile technique with DuraPrep. Surgical site confirmation was performed.  A supraumbilical incision was made down to the fascia. A Veress needle was introduced into the abdominal cavity and confirmation of placement was done using the saline drop test. The abdomen was then insufflated to 16 mmHg pressure. An 11 mm trocar was introduced into the abdominal cavity under direct visualization without difficulty. The patient was placed in reverse Trendelenburg position and an additional 11 mm trocar was placed the epigastric region and 5 mm trochars were placed the right upper quadrant and right flank regions. The liver was inspected and noted to be within normal limits. The gallbladder was retracted in a dynamic fashion in order to provide a critical view of the triangle of. The gallbladder wall was thickened and edematous. It did have to be decompressed in order to grasp it. The cystic duct was first identified. Its juncture to the infundibulum was fully identified. Endoclips were placed proximally and distally on cystic duct, and the cystic duct was divided. This was likewise done to the cystic artery. The gallbladder was freed away from the gallbladder  fossa using Bovie electrocautery. The gallbladder was delivered through the epigastric trocar site using an Endo Catch bag. The gallbladder fossa was inspected and no abnormal bleeding was noted. Surgicel was placed the gallbladder fossa. All fluid and air were then evacuated from the abdominal cavity prior to the removal of the trochars.  All wounds were irrigated with normal saline. All wounds were injected with 0.5% Sensorcaine. The epigastric fascia was reapproximated using an 0 Vicryl interrupted suture. All skin incisions were closed using staples. Betadine ointment and dry sterile dressings were applied.  All tape and needle counts were correct at the end of the procedure. Patient was extubated in the operating room and transferred to PACU in stable condition.  Complications:  none  EBL:  50cc  Specimen:  gallbladder

## 2017-01-10 NOTE — Progress Notes (Signed)
Inpatient Diabetes Program Recommendations  AACE/ADA: New Consensus Statement on Inpatient Glycemic Control (2015)  Target Ranges:  Prepandial:   less than 140 mg/dL      Peak postprandial:   less than 180 mg/dL (1-2 hours)      Critically ill patients:  140 - 180 mg/dL   Results for Marcus Mathis, Marcus Mathis (MRN [id number]) as of 01/10/2017 09:20  Ref. Range 01/09/2017 12:00 01/10/2017 04:04  Glucose Latest Ref Range: 65 - 99 mg/dL 195 (H) 138 (H)   Results for [name], [name] (MRN [id number]) as of 01/10/2017 09:20  Ref. Range 01/09/2017 12:00  Hemoglobin A1C Latest Ref Range: 4.8 - 5.6 % 6.8 (H)   Review of Glycemic Control  Diabetes history: NO Outpatient Diabetes medications: NA Current orders for Inpatient glycemic control: None  Inpatient Diabetes Program Recommendations: Correction (SSI): While inpatient, please consider ordering CBGs with Novolog 0-9 units TID with meals and Novolog 0-5 units QHS. HgbA1C: A1C 6.8% on 01/09/17. Per ADA, if A1C 6.5% or greater then criteria met to dx with DM.  MD, please note in progress note if patient will be newly dx with DM. If so, please inform patient and nursing staff so that patient can be educated on DM while inpatient.  Thanks, Barnie Alderman, RN, MSN, CDE Diabetes Coordinator Inpatient Diabetes Program [phone] (Team Pager from 8am to 5pm)

## 2017-01-10 NOTE — Consult Note (Signed)
Reason for Consult:right upper quadrant abdominal pain Referring Physician: Dr. Norman Herrlich is an 46 y.o. male.  HPI: patient is a 46 year old black male who was presented to the hospital yesterday with a 24-hour history of worsening right upper quadrant abdominal pain.workup revealed acute cholecystitis with cholelithiasis. He states he has had 2 episodes in the past. He currently has a pain of 3 out of 10. He denies any nausea or vomiting at the present time. He denies any fever, chills, or jaundice.  Past Medical History:  Diagnosis Date  . Hypertension     History reviewed. No pertinent surgical history.  History reviewed. No pertinent family history.  Social History:  reports that he has never smoked. He has never used smokeless tobacco. He reports that he does not drink alcohol or use drugs.  Allergies: No Known Allergies  Medications: Scheduled:   Results for orders placed or performed during the hospital encounter of 01/09/17 (from the past 48 hour(s))  Lipase, blood     Status: None   Collection Time: 01/09/17 12:00 PM  Result Value Ref Range   Lipase 19 11 - 51 U/L  Comprehensive metabolic panel     Status: Abnormal   Collection Time: 01/09/17 12:00 PM  Result Value Ref Range   Sodium 130 (L) 135 - 145 mmol/L   Potassium 3.6 3.5 - 5.1 mmol/L   Chloride 95 (L) 101 - 111 mmol/L   CO2 26 22 - 32 mmol/L   Glucose, Bld 195 (H) 65 - 99 mg/dL   BUN 7 6 - 20 mg/dL   Creatinine, Ser 1.01 0.61 - 1.24 mg/dL   Calcium 9.5 8.9 - 10.3 mg/dL   Total Protein 8.9 (H) 6.5 - 8.1 g/dL   Albumin 4.4 3.5 - 5.0 g/dL   AST 37 15 - 41 U/L   ALT 58 17 - 63 U/L   Alkaline Phosphatase 67 38 - 126 U/L   Total Bilirubin 1.5 (H) 0.3 - 1.2 mg/dL   GFR calc non Af Amer >60 >60 mL/min   GFR calc Af Amer >60 >60 mL/min    Comment: (NOTE) The eGFR has been calculated using the CKD EPI equation. This calculation has not been validated in all clinical situations. eGFR's persistently <60  mL/min signify possible Chronic Kidney Disease.    Anion gap 9 5 - 15  CBC     Status: Abnormal   Collection Time: 01/09/17 12:00 PM  Result Value Ref Range   WBC 11.0 (H) 4.0 - 10.5 K/uL   RBC 5.01 4.22 - 5.81 MIL/uL   Hemoglobin 14.6 13.0 - 17.0 g/dL   HCT 41.7 39.0 - 52.0 %   MCV 83.2 78.0 - 100.0 fL   MCH 29.1 26.0 - 34.0 pg   MCHC 35.0 30.0 - 36.0 g/dL   RDW 12.9 11.5 - 15.5 %   Platelets 313 150 - 400 K/uL  Urinalysis, Routine w reflex microscopic     Status: Abnormal   Collection Time: 01/09/17 12:00 PM  Result Value Ref Range   Color, Urine YELLOW YELLOW   APPearance CLEAR CLEAR   Specific Gravity, Urine 1.016 1.005 - 1.030   pH 5.0 5.0 - 8.0   Glucose, UA 50 (A) NEGATIVE mg/dL   Hgb urine dipstick NEGATIVE NEGATIVE   Bilirubin Urine NEGATIVE NEGATIVE   Ketones, ur NEGATIVE NEGATIVE mg/dL   Protein, ur 100 (A) NEGATIVE mg/dL   Nitrite NEGATIVE NEGATIVE   Leukocytes, UA NEGATIVE NEGATIVE   RBC /  HPF 0-5 0 - 5 RBC/hpf   WBC, UA 0-5 0 - 5 WBC/hpf   Bacteria, UA RARE (A) NONE SEEN   Squamous Epithelial / LPF 0-5 (A) NONE SEEN   Mucus PRESENT   Hemoglobin A1c     Status: Abnormal   Collection Time: 01/09/17 12:00 PM  Result Value Ref Range   Hgb A1c MFr Bld 6.8 (H) 4.8 - 5.6 %    Comment: (NOTE) Pre diabetes:          5.7%-6.4% Diabetes:              >6.4% Glycemic control for   <7.0% adults with diabetes    Mean Plasma Glucose 148.46 mg/dL    Comment: Performed at [facility] [address], [place], [place] [address]  VITAMIN D 25 Hydroxy (Vit-D Deficiency, Fractures)     Status: Abnormal   Collection Time: 01/09/17 12:00 PM  Result Value Ref Range   Vit D, 25-Hydroxy 17.1 (L) 30.0 - 100.0 ng/mL    Comment: (NOTE) Vitamin D deficiency has been defined by the Institute of Medicine and an Endocrine Society practice guideline as a level of serum 25-OH vitamin D less than 20 ng/mL (1,2). The Endocrine Society went on to further define vitamin  D insufficiency as a level between 21 and 29 ng/mL (2). 1. IOM (Institute of Medicine). 2010. Dietary reference   intakes for calcium and D. Armstrong: The   [employer]. 2. Holick MF, Binkley , Bischoff-Ferrari HA, et al.   Evaluation, treatment, and prevention of vitamin D   deficiency: an Endocrine Society clinical practice   guideline. JCEM. 2011 Jul; 96(7):1911-30. Performed At: [facility] [place], [place] [id number] Lindon Romp MD UO:[id number]   Surgical pcr screen     Status: None   Collection Time: 01/09/17  7:49 PM  Result Value Ref Range   MRSA, PCR NEGATIVE NEGATIVE   Staphylococcus aureus NEGATIVE NEGATIVE    Comment: (NOTE) The Xpert SA Assay (FDA approved for NASAL specimens in patients 40 years of age and older), is one component of a comprehensive surveillance program. It is not intended to diagnose infection nor to guide or monitor treatment.   Comprehensive metabolic panel     Status: Abnormal   Collection Time: 01/10/17  4:04 AM  Result Value Ref Range   Sodium 135 135 - 145 mmol/L   Potassium 3.6 3.5 - 5.1 mmol/L   Chloride 100 (L) 101 - 111 mmol/L   CO2 26 22 - 32 mmol/L   Glucose, Bld 138 (H) 65 - 99 mg/dL   BUN 6 6 - 20 mg/dL   Creatinine, Ser 0.92 0.61 - 1.24 mg/dL   Calcium 8.9 8.9 - 10.3 mg/dL   Total Protein 8.0 6.5 - 8.1 g/dL   Albumin 3.8 3.5 - 5.0 g/dL   AST 23 15 - 41 U/L   ALT 42 17 - 63 U/L   Alkaline Phosphatase 61 38 - 126 U/L   Total Bilirubin 2.5 (H) 0.3 - 1.2 mg/dL   GFR calc non Af Amer >60 >60 mL/min   GFR calc Af Amer >60 >60 mL/min    Comment: (NOTE) The eGFR has been calculated using the CKD EPI equation. This calculation has not been validated in all clinical situations. eGFR's persistently <60 mL/min signify possible Chronic Kidney Disease.    Anion gap 9 5 - 15  Magnesium     Status: None   Collection Time: 01/10/17  4:04 AM  Result Value Ref Range   Magnesium 2.0 1.7 -  2.4 mg/dL  CBC     Status: Abnormal   Collection Time: 01/10/17  4:04 AM  Result Value Ref Range   WBC 15.2 (H) 4.0 - 10.5 K/uL   RBC 4.86 4.22 - 5.81 MIL/uL   Hemoglobin 13.9 13.0 - 17.0 g/dL   HCT 40.5 39.0 - 52.0 %   MCV 83.3 78.0 - 100.0 fL   MCH 28.6 26.0 - 34.0 pg   MCHC 34.3 30.0 - 36.0 g/dL   RDW 13.0 11.5 - 15.5 %   Platelets 321 150 - 400 K/uL  Lipid panel     Status: Abnormal   Collection Time: 01/10/17  4:04 AM  Result Value Ref Range   Cholesterol 141 0 - 200 mg/dL   Triglycerides 74 <150 mg/dL   HDL 40 (L) >40 mg/dL   Total CHOL/HDL Ratio 3.5 RATIO   VLDL 15 0 - 40 mg/dL   LDL Cholesterol 86 0 - 99 mg/dL    Comment:        Total Cholesterol/HDL:CHD Risk Coronary Heart Disease Risk Table                     Men   Women  1/2 Average Risk   3.4   3.3  Average Risk       5.0   4.4  2 X Average Risk   9.6   7.1  3 X Average Risk  23.4   11.0        Use the calculated Patient Ratio above and the CHD Risk Table to determine the patient's CHD Risk.        ATP III CLASSIFICATION (LDL):  <100     mg/dL   Optimal  100-129  mg/dL   Near or Above                    Optimal  130-159  mg/dL   Borderline  160-189  mg/dL   High  >190     mg/dL   Very High     US Abdomen Limited  Result Date: 01/09/2017 CLINICAL DATA:  Right upper quadrant pain with nausea and vomiting. EXAM: ULTRASOUND ABDOMEN LIMITED RIGHT UPPER QUADRANT COMPARISON:  None. FINDINGS: Gallbladder: Gallstones are evident, measuring up to 2.8 cm. There is gallbladder wall thickening with apparent gallbladder wall edema. Trace amount of pericholecystic fluid evident. Sonographer reports no sonographic Murphy sign. Common bile duct: Diameter: 4-5 mm Liver: Diffusely increased echogenicity suggests fatty deposition. Portal vein is patent on color Doppler imaging with normal direction of blood flow towards the liver. IMPRESSION: 1. Cholelithiasis with gallbladder wall edema/thickening and trace pericholecystic  fluid. The sonographic imaging features are highly suspicious for acute cholecystitis. 2. No biliary dilatation. Electronically Signed   By: Misty Stanley M.D.   On: 01/09/2017 14:00    ROS:  Pertinent items are noted in HPI.  Blood pressure 132/75, pulse 92, temperature 98.6 F (37 C), temperature source Oral, resp. rate 16, height '5\' 11"'  (1.803 m), weight 278 lb 9.6 oz (126.4 kg), SpO2 98 %. Physical Exam: obese black male in no acute distress Head is normocephalic, atraumatic Eyes are without scleral icterus Lungs clear auscultation with equal breath sounds bilaterally Heart examination reveals a regular rate and rhythm without S3, S4, murmurs Abdomen is soft with tenderness in the right upper quadrant to palpation. No hepatosplenomegaly, masses, or hernias identified.  Labs and ultrasound  report reviewed  Assessment/Plan: Impression: Acute cholecystitis, cholelithiasis Plan: Patient will be taken to the operating room today for laparoscopic cholecystectomy. The risks and benefits of the procedure including bleeding, infection, hepatobiliary injury, and the possibility of an open procedure were fully explained to the patient, who gave informed consent.  Aviva Signs 01/10/2017, 7:31 AM

## 2017-01-10 NOTE — Anesthesia Procedure Notes (Signed)
Procedure Name: Intubation Date/Time: 01/10/2017 3:54 PM Performed by: Franco NonesYATES, Deeric Cruise S Pre-anesthesia Checklist: Patient identified, Patient being monitored, Timeout performed, Emergency Drugs available and Suction available Patient Re-evaluated:Patient Re-evaluated prior to induction Oxygen Delivery Method: Circle System Utilized Preoxygenation: Pre-oxygenation with 100% oxygen Induction Type: IV induction, Rapid sequence and Cricoid Pressure applied Laryngoscope Size: Miller and 3 Grade View: Grade II Tube type: Oral Tube size: 7.0 mm Number of attempts: 1 Airway Equipment and Method: stylet and Oral airway Placement Confirmation: ETT inserted through vocal cords under direct vision,  positive ETCO2 and breath sounds checked- equal and bilateral Secured at: 23 cm Tube secured with: Tape Dental Injury: Teeth and Oropharynx as per pre-operative assessment

## 2017-01-10 NOTE — Anesthesia Postprocedure Evaluation (Signed)
Anesthesia Post Note  Patient: Marcus Mathis  Procedure(s) Performed: LAPAROSCOPIC CHOLECYSTECTOMY (N/A Abdomen)  Patient location during evaluation: PACU Anesthesia Type: General Level of consciousness: awake and alert Pain management: satisfactory to patient Vital Signs Assessment: post-procedure vital signs reviewed and stable Respiratory status: spontaneous breathing and patient connected to nasal cannula oxygen Cardiovascular status: stable Postop Assessment: no apparent nausea or vomiting Anesthetic complications: no     Last Vitals:  Vitals:   01/10/17 1725 01/10/17 1730  BP:    Pulse: 83 88  Resp: (!) 34 (!) 25  Temp:    SpO2: 100% 100%    Last Pain:  Vitals:   01/10/17 1711  TempSrc:   PainSc: 0-No pain                 Ammon Muscatello

## 2017-01-10 NOTE — Anesthesia Preprocedure Evaluation (Signed)
Anesthesia Evaluation  Patient identified by MRN, date of birth, ID band Patient awake    Reviewed: Allergy & Precautions, NPO status , Patient's Chart, lab work & pertinent test results  Airway Mallampati: II  TM Distance: >3 FB Neck ROM: Full    Dental  (+) Teeth Intact   Pulmonary neg pulmonary ROS,    breath sounds clear to auscultation       Cardiovascular hypertension, Pt. on medications  Rhythm:Regular Rate:Normal     Neuro/Psych negative neurological ROS  negative psych ROS   GI/Hepatic negative GI ROS, Neg liver ROS,   Endo/Other  negative endocrine ROS  Renal/GU negative Renal ROS     Musculoskeletal   Abdominal   Peds  Hematology negative hematology ROS (+)   Anesthesia Other Findings   Reproductive/Obstetrics                             Anesthesia Physical Anesthesia Plan  ASA: II  Anesthesia Plan: General   Post-op Pain Management:    Induction: Intravenous, Rapid sequence and Cricoid pressure planned  PONV Risk Score and Plan:   Airway Management Planned: Oral ETT  Additional Equipment:   Intra-op Plan:   Post-operative Plan: Extubation in OR  Informed Consent: I have reviewed the patients History and Physical, chart, labs and discussed the procedure including the risks, benefits and alternatives for the proposed anesthesia with the patient or authorized representative who has indicated his/her understanding and acceptance.     Plan Discussed with:   Anesthesia Plan Comments:         Anesthesia Quick Evaluation

## 2017-01-10 NOTE — Transfer of Care (Signed)
Immediate Anesthesia Transfer of Care Note  Patient: Marcus Mathis A Buser  Procedure(s) Performed: LAPAROSCOPIC CHOLECYSTECTOMY (N/A Abdomen)  Patient Location: PACU  Anesthesia Type:General  Level of Consciousness: sedated and patient cooperative  Airway & Oxygen Therapy: Patient Spontanous Breathing and non-rebreather face mask  Post-op Assessment: Report given to RN and Post -op Vital signs reviewed and stable  Post vital signs: Reviewed and stable  Last Vitals:  Vitals:   01/10/17 1515 01/10/17 1530  BP: 140/90   Pulse:    Resp: (!) 42 (!) 43  Temp:    SpO2: 93% 92%    Last Pain:  Vitals:   01/10/17 1447  TempSrc:   PainSc: 3       Patients Stated Pain Goal: 7 (01/10/17 1447)  Complications: No apparent anesthesia complications

## 2017-01-10 NOTE — Plan of Care (Signed)
Problem: Pain Managment: Goal: General experience of comfort will improve Outcome: Progressing Pt admitted for abdominal pain. Pt received Morphine on day shift, has had no request for pain medication so far this shift. Pt rated pain 3/10 earlier and was given ice pack and repositioned. Pt educated on pain mgmt as well as medications available. Pt verbalized understanding. Will continue to monitor pt

## 2017-01-11 ENCOUNTER — Encounter (HOSPITAL_COMMUNITY): Payer: Self-pay | Admitting: General Surgery

## 2017-01-11 LAB — COMPREHENSIVE METABOLIC PANEL
ALBUMIN: 3.5 g/dL (ref 3.5–5.0)
ALT: 69 U/L — ABNORMAL HIGH (ref 17–63)
ANION GAP: 7 (ref 5–15)
AST: 66 U/L — ABNORMAL HIGH (ref 15–41)
Alkaline Phosphatase: 67 U/L (ref 38–126)
BUN: 8 mg/dL (ref 6–20)
CHLORIDE: 99 mmol/L — AB (ref 101–111)
CO2: 27 mmol/L (ref 22–32)
Calcium: 8.6 mg/dL — ABNORMAL LOW (ref 8.9–10.3)
Creatinine, Ser: 1.08 mg/dL (ref 0.61–1.24)
GFR calc Af Amer: >60 mL/min (ref >60)
GFR calc non Af Amer: >60 mL/min (ref >60)
GLUCOSE: 127 mg/dL — AB (ref 65–99)
POTASSIUM: 3.8 mmol/L (ref 3.5–5.1)
SODIUM: 133 mmol/L — AB (ref 135–145)
Total Bilirubin: 2.3 mg/dL — ABNORMAL HIGH (ref 0.3–1.2)
Total Protein: 7.6 g/dL (ref 6.5–8.1)

## 2017-01-11 LAB — CBC
HCT: 39.1 % (ref 39.0–52.0)
Hemoglobin: 13.2 g/dL (ref 13.0–17.0)
MCH: 29.1 pg (ref 26.0–34.0)
MCHC: 33.8 g/dL (ref 30.0–36.0)
MCV: 86.1 fL (ref 78.0–100.0)
PLATELETS: 273 10*3/uL (ref 150–400)
RBC: 4.54 MIL/uL (ref 4.22–5.81)
RDW: 13.1 % (ref 11.5–15.5)
WBC: 12.5 10*3/uL — ABNORMAL HIGH (ref 4.0–10.5)

## 2017-01-11 LAB — HIV ANTIBODY (ROUTINE TESTING W REFLEX): HIV Screen 4th Generation wRfx: NONREACTIVE

## 2017-01-11 NOTE — Anesthesia Postprocedure Evaluation (Signed)
Anesthesia Post Note  Patient: Marcus Mathis  Procedure(s) Performed: LAPAROSCOPIC CHOLECYSTECTOMY (N/A Abdomen)  Patient location during evaluation: Other Anesthesia Type: General Level of consciousness: oriented Pain management: pain level controlled Vital Signs Assessment: post-procedure vital signs reviewed and stable Respiratory status: spontaneous breathing Anesthetic complications: no Comments: Patient is drowsy but responsive to questions- has just received pain medication.  No complaint of N&V, sore throat, recall of surgery or unexpected sore muscles.  Patient pleased with care and voiced no complaints.     Last Vitals:  Vitals:   01/10/17 2217 01/11/17 0624  BP: 101/66 110/67  Pulse: 91 93  Resp: 20 14  Temp: (!) 36.3 C 37.6 C  SpO2: 96% 95%    Last Pain:  Vitals:   01/11/17 0900  TempSrc:   PainSc: 6                  Serafina Topham D

## 2017-01-11 NOTE — Anesthesia Postprocedure Evaluation (Signed)
Anesthesia Post Note  Patient: Marcus Mathis  Procedure(s) Performed: LAPAROSCOPIC CHOLECYSTECTOMY (N/A Abdomen)  Anesthesia Type: General     Last Vitals:  Vitals:   01/10/17 2217 01/11/17 0624  BP: 101/66 110/67  Pulse: 91 93  Resp: 20 14  Temp: (!) 36.3 C 37.6 C  SpO2: 96% 95%    Last Pain:  Vitals:   01/11/17 0900  TempSrc:   PainSc: 6                  Darlette Dubow D

## 2017-01-11 NOTE — Progress Notes (Signed)
1 Day Post-Op  Subjective: Patient having moderate incisional pain. Controlled with IV Dilaudid.  Objective: Vital signs in last 24 hours: Temp:  [97.4 F (36.3 C)-99.7 F (37.6 C)] 99.7 F (37.6 C) (10/18 0624) Pulse Rate:  [77-104] 93 (10/18 0624) Resp:  [14-43] 14 (10/18 0624) BP: (101-140)/(60-90) 110/67 (10/18 0624) SpO2:  [90 %-100 %] 95 % (10/18 0624) Last BM Date: 01/10/17  Intake/Output from previous day: 10/17 0701 - 10/18 0700 In: 2728.3 [P.O.:360; I.V.:2368.3] Out: 680 [Urine:670; Blood:10] Intake/Output this shift: No intake/output data recorded.  General appearance: alert, cooperative and no distress Resp: clear to auscultation bilaterally Cardio: regular rate and rhythm, S1, S2 normal, no murmur, click, rub or gallop GI: Soft, dressings dry and intact.  Lab Results:   Recent Labs  01/09/17 1200 01/10/17 0404  WBC 11.0* 15.2*  HGB 14.6 13.9  HCT 41.7 40.5  PLT 313 321   BMET  Recent Labs  01/09/17 1200 01/10/17 0404  NA 130* 135  K 3.6 3.6  CL 95* 100*  CO2 26 26  GLUCOSE 195* 138*  BUN 7 6  CREATININE 1.01 0.92  CALCIUM 9.5 8.9   PT/INR No results for input(s): LABPROT, INR in the last 72 hours.  Studies/Results: [place] Abdomen Limited  Result Date: 01/09/2017 CLINICAL DATA:  Right upper quadrant pain with nausea and vomiting. EXAM: ULTRASOUND ABDOMEN LIMITED RIGHT UPPER QUADRANT COMPARISON:  None. FINDINGS: Gallbladder: Gallstones are evident, measuring up to 2.8 cm. There is gallbladder wall thickening with apparent gallbladder wall edema. Trace amount of pericholecystic fluid evident. Sonographer reports no sonographic Murphy sign. Common bile duct: Diameter: 4-5 mm Liver: Diffusely increased echogenicity suggests fatty deposition. Portal vein is patent on color Doppler imaging with normal direction of blood flow towards the liver. IMPRESSION: 1. Cholelithiasis with gallbladder wall edema/thickening and trace pericholecystic fluid. The  sonographic imaging features are highly suspicious for acute cholecystitis. 2. No biliary dilatation. Electronically Signed   By: Kennith CenterEric  Mansell M.D.   On: 01/09/2017 14:00    Anti-infectives: Anti-infectives    Start     Dose/Rate Route Frequency Ordered Stop   01/11/17 1600  cefTRIAXone (ROCEPHIN) 2 g in dextrose 5 % 50 mL IVPB     2 g 100 mL/hr over 30 Minutes Intravenous Every 24 hours 01/10/17 1755     01/10/17 1600  cefTRIAXone (ROCEPHIN) 2 g in dextrose 5 % 50 mL IVPB  Status:  Discontinued     2 g 100 mL/hr over 30 Minutes Intravenous Every 24 hours 01/09/17 1834 01/10/17 1754   01/09/17 1500  cefTRIAXone (ROCEPHIN) 2 g in dextrose 5 % 50 mL IVPB     2 g 100 mL/hr over 30 Minutes Intravenous  Once 01/09/17 1450 01/09/17 1638      Assessment/Plan: s/p Procedure(s): LAPAROSCOPIC CHOLECYSTECTOMY Impression: Stable on postoperative day 1. Labs pending. Given his extensive acute cholecystitis, we will continue IV Rocephin at this time. Anticipate discharge in next 24-48 hours.  LOS: 2 days    Marcus MachoMark Bela Bonaparte 01/11/2017

## 2017-01-11 NOTE — Progress Notes (Signed)
Inpatient Diabetes Program Recommendations  AACE/ADA: New Consensus Statement on Inpatient Glycemic Control (2015)  Target Ranges:  Prepandial:   less than 140 mg/dL      Peak postprandial:   less than 180 mg/dL (1-2 hours)      Critically ill patients:  140 - 180 mg/dL   Results for ANDRIAN, SABALA (MRN [id number]) as of 01/11/2017 14:27  Ref. Range 01/09/2017 12:00  Hemoglobin A1C Latest Ref Range: 4.8 - 5.6 % 6.8 (H)   Results for DEJUAN, ELMAN (MRN [id number]) as of 01/11/2017 14:27  Ref. Range 01/09/2017 12:00 01/10/2017 04:04 01/11/2017 08:58  Glucose Latest Ref Range: 65 - 99 mg/dL 195 (H) 138 (H) 127 (H)     Review of Glycemic Control  Diabetes history: NO  Outpatient Diabetes medications: NA  Current orders for Inpatient glycemic control: None      MD- Hemoglobin A1C level was 6.8% on 01/09/17.   Per ADA, if A1C 6.5% or greater then criteria met to dx with DM.    MD, please note in progress note if patient will be newly dx with DM. If so, please inform patient and nursing staff so that patient can be educated on DM while inpatient.      --Will follow patient during hospitalization--  Wyn Quaker RN, MSN, CDE Diabetes Coordinator Inpatient Glycemic Control Team Team Pager: [phone] (8a-5p)

## 2017-01-11 NOTE — Addendum Note (Signed)
Addendum  created 01/11/17 [id number] by Donney DiceKelly, Coreena Rubalcava D, CRNA   Sign clinical note

## 2017-01-12 LAB — HEPATIC FUNCTION PANEL
ALK PHOS: 74 U/L (ref 38–126)
ALT: 92 U/L — AB (ref 17–63)
AST: 65 U/L — AB (ref 15–41)
Albumin: 3.1 g/dL — ABNORMAL LOW (ref 3.5–5.0)
BILIRUBIN DIRECT: 0.8 mg/dL — AB (ref 0.1–0.5)
BILIRUBIN INDIRECT: 1.8 mg/dL — AB (ref 0.3–0.9)
BILIRUBIN TOTAL: 2.6 mg/dL — AB (ref 0.3–1.2)
Total Protein: 7.4 g/dL (ref 6.5–8.1)

## 2017-01-12 LAB — CBC
HCT: 36.6 % — ABNORMAL LOW (ref 39.0–52.0)
HEMOGLOBIN: 12.3 g/dL — AB (ref 13.0–17.0)
MCH: 28.6 pg (ref 26.0–34.0)
MCHC: 33.6 g/dL (ref 30.0–36.0)
MCV: 85.1 fL (ref 78.0–100.0)
PLATELETS: 278 10*3/uL (ref 150–400)
RBC: 4.3 MIL/uL (ref 4.22–5.81)
RDW: 12.9 % (ref 11.5–15.5)
WBC: 14.1 10*3/uL — ABNORMAL HIGH (ref 4.0–10.5)

## 2017-01-12 MED ORDER — PIPERACILLIN-TAZOBACTAM 3.375 G IVPB
3.3750 g | Freq: Three times a day (TID) | INTRAVENOUS | Status: DC
Start: 2017-01-12 — End: 2017-01-13
  Administered 2017-01-12 – 2017-01-13 (×3): 3.375 g via INTRAVENOUS
  Filled 2017-01-12 (×3): qty 50

## 2017-01-12 MED ORDER — PIPERACILLIN-TAZOBACTAM 3.375 G IVPB 30 MIN: 3.3750 g | Freq: Four times a day (QID) | INTRAVENOUS | Status: DC

## 2017-01-12 NOTE — Care Management Note (Signed)
Case Management Note  Patient Details  Name: Marcus Mathis MRN: [id number] Date of Birth: November 07, 1970  Subjective/Objective:                 Admitted with cholecystitis. Pt is from home, ind, employed, has PCP, transportation and insurance with drug coverage. Communicates no need or concerns at DC.    Action/Plan: DC home with self care. No CM needs noted.   Expected Discharge Date:     01/13/2017             Expected Discharge Plan:  Home/Self Care  In-House Referral:  NA  Discharge planning Services  CM Consult  Post Acute Care Choice:  NA Choice offered to:  NA  Status of Service:  Completed, signed off  Malcolm MetroChildress, Sheccid Lahmann Demske, RN 01/12/2017, 2:59 PM

## 2017-01-12 NOTE — Progress Notes (Signed)
2 Days Post-Op  Subjective: Patient states incisional pain has decreased. No nausea or vomiting noted.  Objective: Vital signs in last 24 hours: Temp:  [98.2 F (36.8 C)-100.5 F (38.1 C)] 99.1 F (37.3 C) (10/19 0511) Pulse Rate:  [98-110] 98 (10/19 0511) Resp:  [16-18] 18 (10/18 2141) BP: (108-125)/(61-68) 108/61 (10/19 0511) SpO2:  [91 %-94 %] 94 % (10/19 0511) Last BM Date: 01/12/17  Intake/Output from previous day: 10/18 0701 - 10/19 0700 In: 2221.7 [P.O.:350; I.V.:1821.7; IV Piggyback:50] Out: 2850 [Urine:2850] Intake/Output this shift: No intake/output data recorded.  General appearance: alert, cooperative and no distress Resp: clear to auscultation bilaterally Cardio: regular rate and rhythm, S1, S2 normal, no murmur, click, rub or gallop GI: Soft, incisions healing well.  Lab Results:   Recent Labs  01/11/17 0858 01/12/17 0545  WBC 12.5* 14.1*  HGB 13.2 12.3*  HCT 39.1 36.6*  PLT 273 278   BMET  Recent Labs  01/10/17 0404 01/11/17 0858  NA 135 133*  K 3.6 3.8  CL 100* 99*  CO2 26 27  GLUCOSE 138* 127*  BUN 6 8  CREATININE 0.92 1.08  CALCIUM 8.9 8.6*   PT/INR No results for input(s): LABPROT, INR in the last 72 hours.  Studies/Results: No results found.  Anti-infectives: Anti-infectives    Start     Dose/Rate Route Frequency Ordered Stop   01/12/17 1200  piperacillin-tazobactam (ZOSYN) IVPB 3.375 g  Status:  Discontinued     3.375 g 100 mL/hr over 30 Minutes Intravenous Every 6 hours 01/12/17 0900 01/12/17 0912   01/12/17 1000  piperacillin-tazobactam (ZOSYN) IVPB 3.375 g     3.375 g 12.5 mL/hr over 240 Minutes Intravenous Every 8 hours 01/12/17 0912     01/11/17 1600  cefTRIAXone (ROCEPHIN) 2 g in dextrose 5 % 50 mL IVPB  Status:  Discontinued     2 g 100 mL/hr over 30 Minutes Intravenous Every 24 hours 01/10/17 1755 01/12/17 0900   01/10/17 1600  cefTRIAXone (ROCEPHIN) 2 g in dextrose 5 % 50 mL IVPB  Status:  Discontinued     2 g 100  mL/hr over 30 Minutes Intravenous Every 24 hours 01/09/17 1834 01/10/17 1754   01/09/17 1500  cefTRIAXone (ROCEPHIN) 2 g in dextrose 5 % 50 mL IVPB     2 g 100 mL/hr over 30 Minutes Intravenous  Once 01/09/17 1450 01/09/17 1638      Assessment/Plan: s/p Procedure(s): LAPAROSCOPIC CHOLECYSTECTOMY Impression: Stable on postoperative day 2, status post laparoscopic cholecystectomy for acute cholecystitis. Patient still has elevations in his liver enzyme tests, which is not surprising. He does not have evidence of a bile leak at this time. We will continue to monitor. Should they not start normalizing, we will get HIDA scan. Will switch to Zosyn for IV antibiotic coverage.  LOS: 3 days    Marcus MachoMark Makani Mathis 01/12/2017

## 2017-01-13 LAB — CBC
HCT: 34.9 % — ABNORMAL LOW (ref 39.0–52.0)
HEMOGLOBIN: 12 g/dL — AB (ref 13.0–17.0)
MCH: 28.8 pg (ref 26.0–34.0)
MCHC: 34.4 g/dL (ref 30.0–36.0)
MCV: 83.7 fL (ref 78.0–100.0)
PLATELETS: 283 10*3/uL (ref 150–400)
RBC: 4.17 MIL/uL — AB (ref 4.22–5.81)
RDW: 12.6 % (ref 11.5–15.5)
WBC: 11.4 10*3/uL — AB (ref 4.0–10.5)

## 2017-01-13 LAB — HEPATIC FUNCTION PANEL
ALT: 80 U/L — ABNORMAL HIGH (ref 17–63)
AST: 36 U/L (ref 15–41)
Albumin: 3.1 g/dL — ABNORMAL LOW (ref 3.5–5.0)
Alkaline Phosphatase: 83 U/L (ref 38–126)
BILIRUBIN DIRECT: 0.4 mg/dL (ref 0.1–0.5)
Indirect Bilirubin: 1.2 mg/dL — ABNORMAL HIGH (ref 0.3–0.9)
Total Bilirubin: 1.6 mg/dL — ABNORMAL HIGH (ref 0.3–1.2)
Total Protein: 7.5 g/dL (ref 6.5–8.1)

## 2017-01-13 MED ORDER — CIPROFLOXACIN HCL 500 MG PO TABS: 500.0000 mg | ORAL_TABLET | Freq: Two times a day (BID) | ORAL | 0 refills | Status: AC

## 2017-01-13 MED ORDER — OXYCODONE-ACETAMINOPHEN 5-325 MG PO TABS: 1.0000 | ORAL_TABLET | ORAL | 0 refills | Status: AC | PRN

## 2017-01-13 NOTE — Discharge Instructions (Signed)
Laparoscopic Cholecystectomy, Care After °This sheet gives you information about how to care for yourself after your procedure. Your health care provider may also give you more specific instructions. If you have problems or questions, contact your health care provider. °What can I expect after the procedure? °After the procedure, it is common to have: °· Pain at your incision sites. You will be given medicines to control this pain. °· Mild nausea or vomiting. °· Bloating and possible shoulder pain from the air-like gas that was used during the procedure. °Follow these instructions at home: °Incision care  ° °· Follow instructions from your health care provider about how to take care of your incisions. Make sure you: °¨ Wash your hands with soap and water before you change your bandage (dressing). If soap and water are not available, use hand sanitizer. °¨ Change your dressing as told by your health care provider. °¨ Leave stitches (sutures), skin glue, or adhesive strips in place. These skin closures may need to be in place for 2 weeks or longer. If adhesive strip edges start to loosen and curl up, you may trim the loose edges. Do not remove adhesive strips completely unless your health care provider tells you to do that. °· Do not take baths, swim, or use a hot tub until your health care provider approves. Ask your health care provider if you can take showers. You may only be allowed to take sponge baths for bathing. °· Check your incision area every day for signs of infection. Check for: °¨ More redness, swelling, or pain. °¨ More fluid or blood. °¨ Warmth. °¨ Pus or a bad smell. °Activity  °· Do not drive or use heavy machinery while taking prescription pain medicine. °· Do not lift anything that is heavier than 10 lb (4.5 kg) until your health care provider approves. °· Do not play contact sports until your health care provider approves. °· Do not drive for 24 hours if you were given a medicine to help you relax  (sedative). °· Rest as needed. Do not return to work or school until your health care provider approves. °General instructions  °· Take over-the-counter and prescription medicines only as told by your health care provider. °· To prevent or treat constipation while you are taking prescription pain medicine, your health care provider may recommend that you: °¨ Drink enough fluid to keep your urine clear or pale yellow. °¨ Take over-the-counter or prescription medicines. °¨ Eat foods that are high in fiber, such as fresh fruits and vegetables, whole grains, and beans. °¨ Limit foods that are high in fat and processed sugars, such as fried and sweet foods. °Contact a health care provider if: °· You develop a rash. °· You have more redness, swelling, or pain around your incisions. °· You have more fluid or blood coming from your incisions. °· Your incisions feel warm to the touch. °· You have pus or a bad smell coming from your incisions. °· You have a fever. °· One or more of your incisions breaks open. °Get help right away if: °· You have trouble breathing. °· You have chest pain. °· You have increasing pain in your shoulders. °· You faint or feel dizzy when you stand. °· You have severe pain in your abdomen. °· You have nausea or vomiting that lasts for more than one day. °· You have leg pain. °This information is not intended to replace advice given to you by your health care provider. Make sure you discuss any   questions you have with your health care provider. °Document Released: 03/13/2005 Document Revised: 10/02/2015 Document Reviewed: 08/30/2015 °Elsevier Interactive Patient Education © 2017 [employer] ° °

## 2017-01-13 NOTE — Discharge Summary (Signed)
Physician Discharge Summary  Patient ID: Marcus Mathis MRN: [id number] DOB/AGE: 1971-03-03 46 y.o.  Admit date: 01/09/2017 Discharge date: 01/13/2017  Admission Diagnoses:Acute cholecystitis, cholelithiasis  Discharge Diagnoses: Same, necrosis of gallbladder Principal Problem:   Acute calculous cholecystitis Active Problems:   Hyponatremia   Hyperglycemia   Hypertension   Cholelithiasis   Leukocytosis   Dehydration   Hyperbilirubinemia   Gout   Discharged Condition: good  Hospital Course: Patient is a 46 year old black male who presented to the emergency room with worsening right upper quadrant abdominal pain. He was found on ultrasound the gallbladder had acute cholecystitis with cholelithiasis. He did have elevations in his liver enzyme tests. He underwent a laparoscopic cholecystectomy on 01/10/2017. He tolerated the procedure well. An inflamed, necrotic gallbladder was found. His postoperative course was remarkable for a leukocytosis and elevation of his liver enzyme tests. They are now starting to normalize. His diet was advanced without difficulty. The patient is being discharged home on 01/13/2017 in good and improving condition.  Treatments: surgery: Laparoscopic cholecystectomy on 01/10/2017  Discharge Exam: Blood pressure 128/83, pulse 83, temperature 98.8 F (37.1 C), temperature source Oral, resp. rate 18, height 5\' 11"  (1.803 m), weight 278 lb 9.6 oz (126.4 kg), SpO2 94 %. General appearance: alert, cooperative and no distress Resp: clear to auscultation bilaterally Cardio: regular rate and rhythm, S1, S2 normal, no murmur, click, rub or gallop GI: Soft, incisions healing well.  Disposition: 01-Home or Self Care  Discharge Instructions    Diet general    Complete by:  As directed    Increase activity slowly    Complete by:  As directed      Allergies as of 01/13/2017   No Known Allergies     Medication List    TAKE these medications    amLODipine-olmesartan 10-40 MG tablet Commonly known as:  AZOR Take 1 tablet by mouth daily.   cetirizine 10 MG tablet Commonly known as:  ZYRTEC Take 10 mg by mouth daily.   ciprofloxacin 500 MG tablet Commonly known as:  CIPRO Take 1 tablet (500 mg total) by mouth 2 (two) times daily.   colchicine 0.6 MG tablet Take 0.6 mg by mouth daily.   oxyCODONE-acetaminophen 5-325 MG tablet Commonly known as:  PERCOCET/ROXICET Take 1 tablet by mouth every 4 (four) hours as needed for moderate pain.      Follow-up Information    Franky MachoJenkins, Devanshi Califf, MD. Schedule an appointment as soon as possible for a visit on 01/18/2017.   Specialty:  General Surgery Contact information: [address] [place] [id number] [phone]           Signed: Franky MachoMark Aryka Coonradt 01/13/2017, 9:50 AM

## 2017-01-13 NOTE — Progress Notes (Signed)
Pt has home CPAP plugged into red outlet. No signs of damage 

## 2017-01-18 ENCOUNTER — Encounter: Payer: Self-pay | Admitting: General Surgery

## 2017-01-18 ENCOUNTER — Ambulatory Visit (INDEPENDENT_AMBULATORY_CARE_PROVIDER_SITE_OTHER): Payer: Self-pay | Admitting: General Surgery

## 2017-01-18 VITALS — BP 116/73 | HR 91 | Temp 95.9°F | Resp 18 | Ht 71.0 in | Wt 270.0 lb

## 2017-01-18 DIAGNOSIS — Z09 Encounter for follow-up examination after completed treatment for conditions other than malignant neoplasm: Secondary | ICD-10-CM

## 2017-01-18 NOTE — Progress Notes (Signed)
Subjective:     Marcus Mathis  Status post laparoscopic cholecystectomy for acute cholecystitis with cholelithiasis. Patient has been recovering well. Objective:    BP 116/73   Pulse 91   Temp (!) 95.9 F (35.5 C)   Resp 18   Ht 5\' 11"  (1.803 m)   Wt 270 lb (122.5 kg)   BMI 37.66 kg/m   General:  alert, cooperative and no distress  Abdomen soft, incisions healing well. Staples removed, Steri-Strips applied. Final pathology consistent with diagnosis.     Assessment:    Doing well postoperatively.    Plan:   May return to work on 01/22/2017 with no heavy lifting and limited bending and lifting for 2 weeks. He then may return to full duty work without restrictions at that time. Follow-up when necessary as needed.

## 2018-08-08 ENCOUNTER — Other Ambulatory Visit: Payer: Self-pay

## 2018-08-08 ENCOUNTER — Emergency Department (HOSPITAL_COMMUNITY)
Admission: EM | Admit: 2018-08-08 | Discharge: 2018-08-08 | Disposition: A | Discharge status: Home / Self Care | Payer: BLUE CROSS/BLUE SHIELD | Attending: Emergency Medicine | Admitting: Emergency Medicine

## 2018-08-08 ENCOUNTER — Emergency Department (HOSPITAL_COMMUNITY): Payer: BLUE CROSS/BLUE SHIELD

## 2018-08-08 ENCOUNTER — Encounter (HOSPITAL_COMMUNITY): Payer: Self-pay | Admitting: *Deleted

## 2018-08-08 DIAGNOSIS — I10 Essential (primary) hypertension: Secondary | ICD-10-CM | POA: Diagnosis not present

## 2018-08-08 DIAGNOSIS — Z03818 Encounter for observation for suspected exposure to other biological agents ruled out: Secondary | ICD-10-CM | POA: Insufficient documentation

## 2018-08-08 DIAGNOSIS — E119 Type 2 diabetes mellitus without complications: Secondary | ICD-10-CM | POA: Diagnosis not present

## 2018-08-08 DIAGNOSIS — R0609 Other forms of dyspnea: Secondary | ICD-10-CM | POA: Insufficient documentation

## 2018-08-08 DIAGNOSIS — R0600 Dyspnea, unspecified: Secondary | ICD-10-CM

## 2018-08-08 DIAGNOSIS — R0602 Shortness of breath: Secondary | ICD-10-CM | POA: Diagnosis present

## 2018-08-08 DIAGNOSIS — M109 Gout, unspecified: Secondary | ICD-10-CM | POA: Diagnosis not present

## 2018-08-08 HISTORY — DX: Sleep apnea, unspecified: G47.30

## 2018-08-08 HISTORY — DX: Type 2 diabetes mellitus without complications: E11.9

## 2018-08-08 LAB — SARS CORONAVIRUS 2 BY RT PCR (HOSPITAL ORDER, PERFORMED IN ~~LOC~~ HOSPITAL LAB): SARS Coronavirus 2: NEGATIVE

## 2018-08-08 MED ORDER — ALBUTEROL SULFATE HFA 108 (90 BASE) MCG/ACT IN AERS
2.0000 | INHALATION_SPRAY | Freq: Once | RESPIRATORY_TRACT | Status: AC
  Administered 2018-08-08: 17:00:00 2 via RESPIRATORY_TRACT
  Filled 2018-08-08: qty 6.7

## 2018-08-08 NOTE — Discharge Instructions (Signed)
Your symptoms may be allergy related or a viral respiratory illness. COVID testing was negative. Your CXR is clear. You can continue to take over the counter cough/cold/allergy medication as needed. Return for worsening symptoms.

## 2018-08-08 NOTE — ED Triage Notes (Signed)
C/o shortness of breath onset today °

## 2018-08-08 NOTE — ED Provider Notes (Signed)
[facility] Provider Note   CSN: [id number] Arrival date & time: 08/08/18  1325    History   Chief Complaint Chief Complaint  Patient presents with  . Shortness of Breath    HPI Marcus Mathis is a 48 y.o. male.     HPI    47yM with dyspnea. Onset today. Feels like he cannot get a deep breath. No cough. No CP. No fever or chills. No unusual leg pain or swelling. Denies orthopnea. No sick contacts that he is aware of.   Past Medical History:  Diagnosis Date  . Diabetes mellitus without complication (HCC)   . Hypertension   . Sleep apnea     Patient Active Problem List   Diagnosis Date Noted  . Acute calculous cholecystitis 01/09/2017  . Hyponatremia 01/09/2017  . Hyperglycemia 01/09/2017  . Hypertension 01/09/2017  . Cholelithiasis 01/09/2017  . Leukocytosis 01/09/2017  . Dehydration 01/09/2017  . Hyperbilirubinemia 01/09/2017  . Gout 01/09/2017    Past Surgical History:  Procedure Laterality Date  . CHOLECYSTECTOMY N/A 01/10/2017   Procedure: LAPAROSCOPIC CHOLECYSTECTOMY;  Surgeon: Franky MachoJenkins, Mark, MD;  Location: [facility];  Service: General;  Laterality: N/A;        Home Medications    Prior to Admission medications   Medication Sig Start Date End Date Taking? Authorizing Provider  amLODipine-olmesartan (AZOR) 10-40 MG tablet Take 1 tablet by mouth daily. 06/19/15   [provider]  cetirizine (ZYRTEC) 10 MG tablet Take 10 mg by mouth daily.    [provider]  colchicine 0.6 MG tablet Take 0.6 mg by mouth daily.    [provider]  oxyCODONE-acetaminophen (PERCOCET/ROXICET) 5-325 MG tablet Take 1 tablet by mouth every 4 (four) hours as needed for moderate pain. 01/13/17   Franky MachoJenkins, Mark, MD    Family History No family history on file.  Social History Social History   Tobacco Use  . Smoking status: Never Smoker  . Smokeless tobacco: Never Used  Substance Use Topics  . Alcohol use: No  . Drug use: No      Allergies   Patient has no known allergies.   Review of Systems Review of Systems  All systems reviewed and negative, other than as noted in HPI.  Physical Exam Updated Vital Signs BP 113/75   Pulse 70   Temp 98.7 F (37.1 C) (Oral)   Resp 16   Ht 5\' 11"  (1.803 m)   Wt 108.9 kg   SpO2 94%   BMI 33.47 kg/m   Physical Exam Vitals signs and nursing note reviewed.  Constitutional:      General: He is not in acute distress.    Appearance: He is well-developed.  HENT:     Head: Normocephalic and atraumatic.  Eyes:     General:        Right eye: No discharge.        Left eye: No discharge.     Conjunctiva/sclera: Conjunctivae normal.  Neck:     Musculoskeletal: Neck supple.  Cardiovascular:     Rate and Rhythm: Normal rate and regular rhythm.     Heart sounds: Normal heart sounds. No murmur. No friction rub. No gallop.   Pulmonary:     Effort: Pulmonary effort is normal. No respiratory distress.     Breath sounds: Normal breath sounds.  Abdominal:     General: There is no distension.     Palpations: Abdomen is soft.     Tenderness: There is no abdominal  tenderness.  Musculoskeletal:        General: No tenderness.     Comments: Lower extremities symmetric as compared to each other. No calf tenderness. Negative Homan's. No palpable cords.   Skin:    General: Skin is warm and dry.  Neurological:     Mental Status: He is alert.  Psychiatric:        Behavior: Behavior normal.        Thought Content: Thought content normal.      ED Treatments / Results  Labs (all labs ordered are listed, but only abnormal results are displayed) Labs Reviewed  SARS CORONAVIRUS 2 (HOSPITAL ORDER, PERFORMED IN [facility])    EKG EKG Interpretation  Date/Time:  Thursday Aug 08 2018 15:28:01 EDT Ventricular Rate:  77 PR Interval:    QRS Duration: 96 QT Interval:  367 QTC Calculation: 416 R Axis:   7 Text Interpretation:  Sinus rhythm Abnormal R-wave  progression, early transition Baseline wander in lead(s) II III aVF Confirmed by Raeford Razor [phone]) on 08/08/2018 3:34:04 PM   Radiology Dg Chest 2 View  Result Date: 08/08/2018 CLINICAL DATA:  Shortness of breath beginning last night. Diabetes and hypertension. EXAM: CHEST - 2 VIEW COMPARISON:  07/06/2015 FINDINGS: Heart size is normal. Mediastinal shadows are normal. The lungs are clear. No bronchial thickening. No infiltrate, mass, effusion or collapse. Pulmonary vascularity is normal. No bony abnormality. IMPRESSION: Normal chest Electronically Signed   By: Paulina Fusi M.D.   On: 08/08/2018 14:01    Procedures Procedures (including critical care time)  Medications Ordered in ED Medications  albuterol (VENTOLIN HFA) 108 (90 Base) MCG/ACT inhaler 2 puff (2 puffs Inhalation Given 08/08/18 1636)     Initial Impression / Assessment and Plan / ED Course  I have reviewed the triage vital signs and the nursing notes.  Pertinent labs & imaging results that were available during my care of the patient were reviewed by me and considered in my medical decision making (see chart for details).     47yM with dyspnea. Looks good. Lungs clear. No increased WOB. o2 sats ok on RA. COVID negative. CXr w/o acute abnormality.  Marcus Mathis was evaluated in Emergency Department on 08/08/2018 for the symptoms described in the history of present illness. He was evaluated in the context of the global COVID-19 pandemic, which necessitated consideration that the patient might be at risk for infection with the SARS-CoV-2 virus that causes COVID-19. Institutional protocols and algorithms that pertain to the evaluation of patients at risk for COVID-19 are in a state of rapid change based on information released by regulatory bodies including the CDC and federal and state organizations. These policies and algorithms were followed during the patient's care in the ED.   Final Clinical Impressions(s) / ED Diagnoses    Final diagnoses:  Dyspnea, unspecified type    ED Discharge Orders    None       Raeford Razor, MD 08/12/18 1021

## 2019-02-08 IMAGING — US US ABDOMEN LIMITED
1 series · 14 of 25 positions shown · non-contrast
Comparison: None.

CLINICAL DATA: Right upper quadrant pain with nausea and vomiting.

EXAM:
ULTRASOUND ABDOMEN LIMITED RIGHT UPPER QUADRANT

[Series 1: us abdomen limited · 0.24mm/px · 14 of 54 slices shown]
[im 1/54]
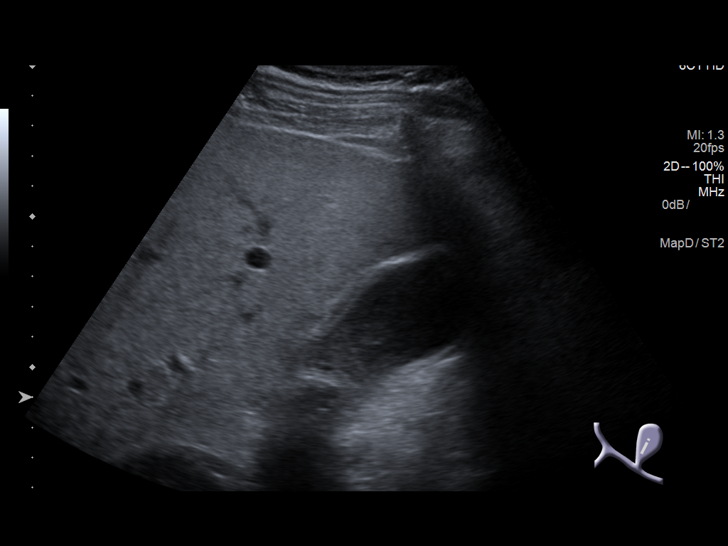
[im 5/54]
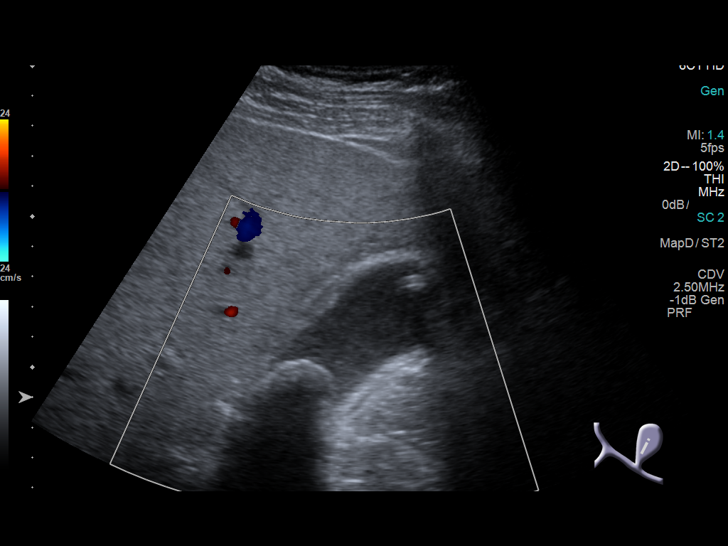
[im 9/54]
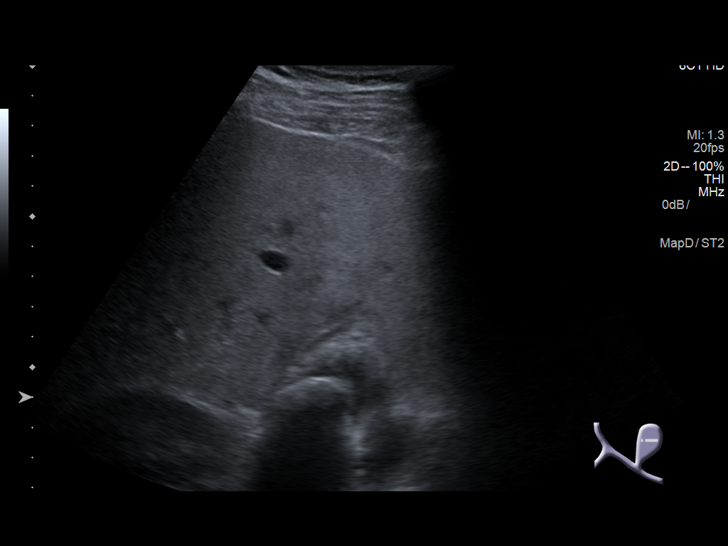
[im 14/54]
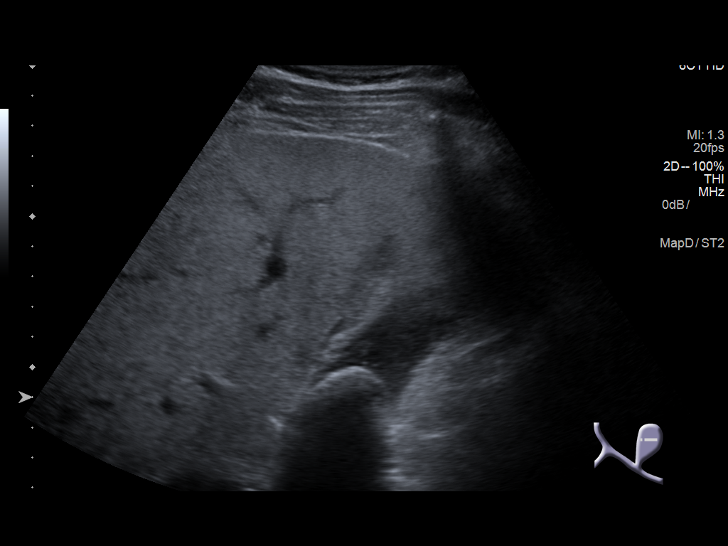
[im 18/54]
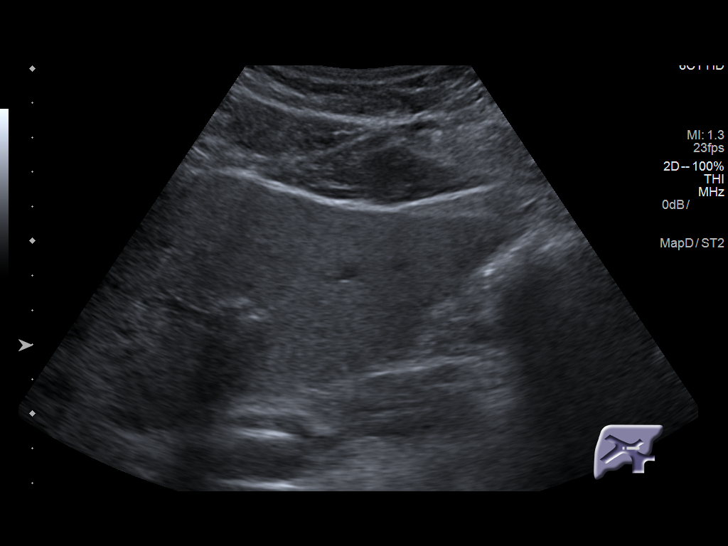
[im 20/54]
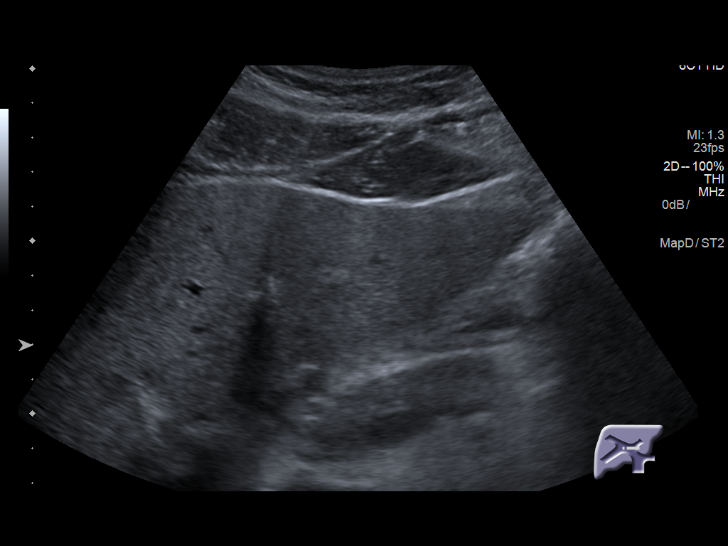
[im 25/54]
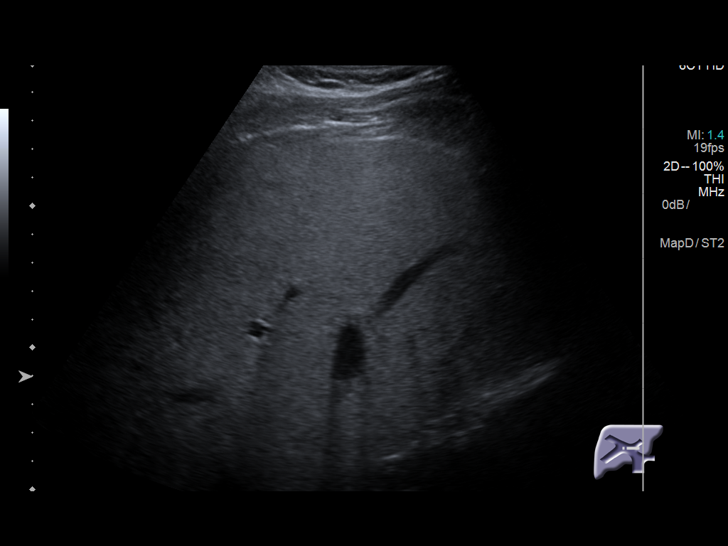
[im 29/54]
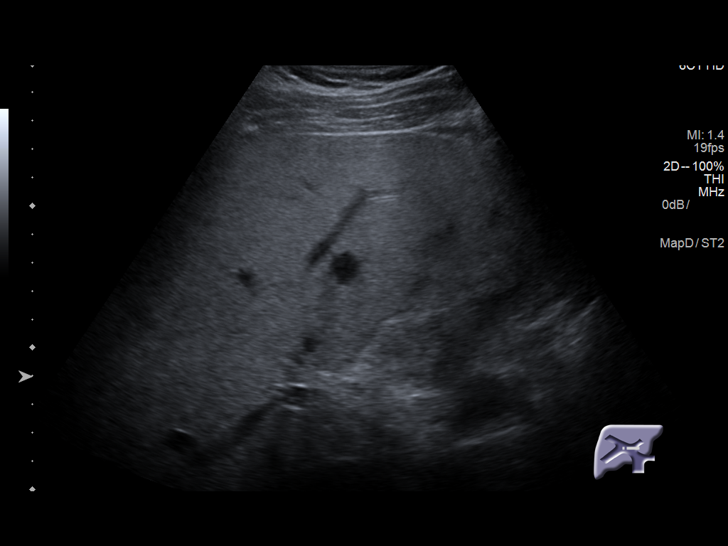
[im 34/54]
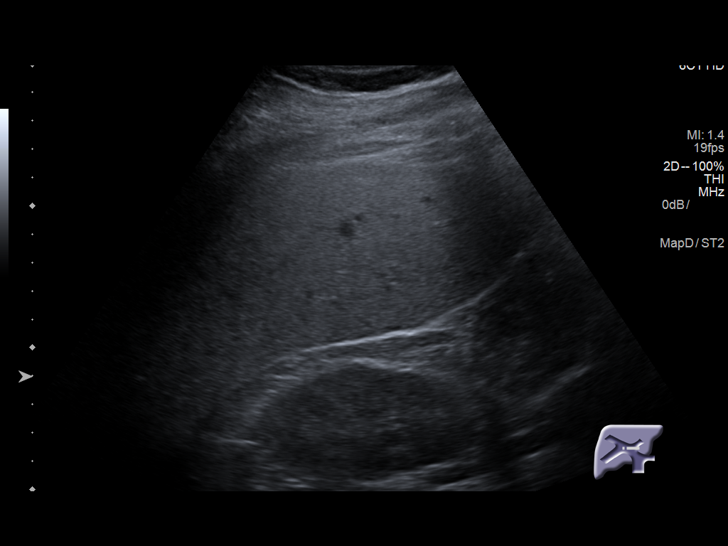
[im 36/54]
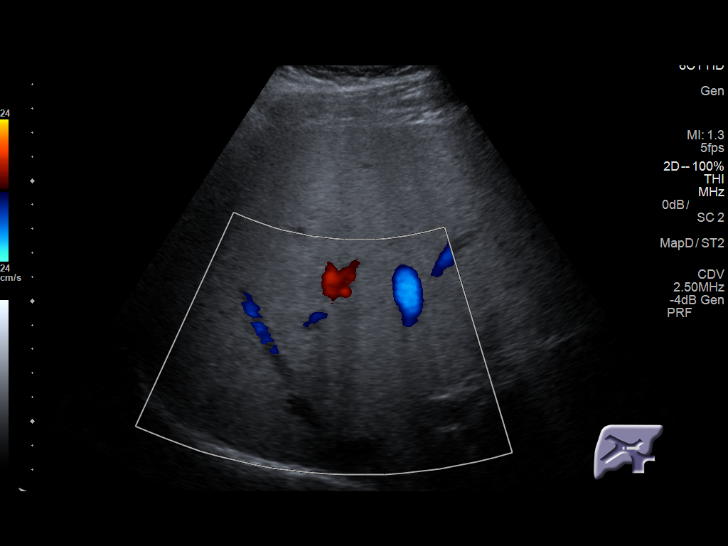
[im 40/54]
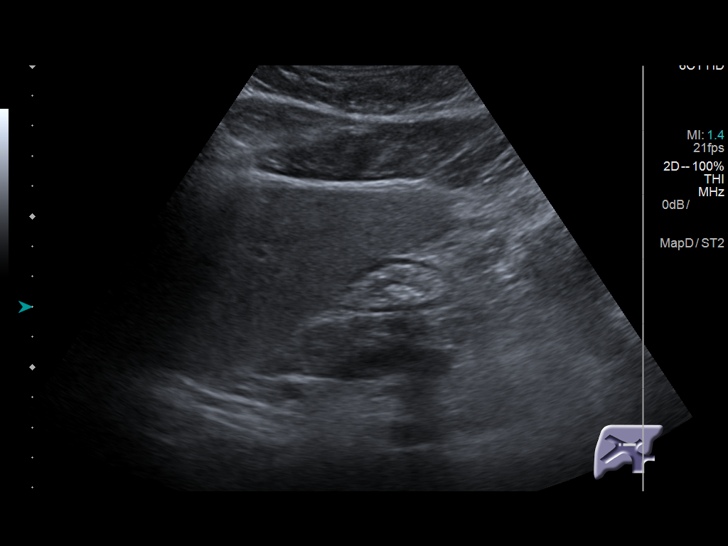
[im 45/54]
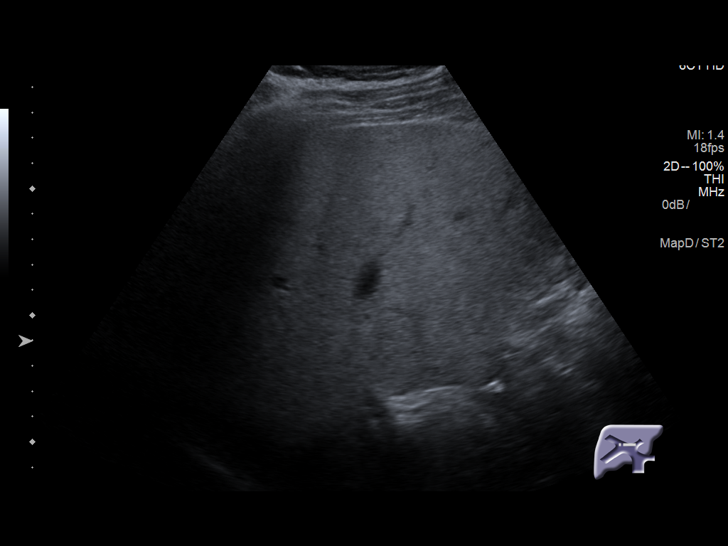
[im 49/54]
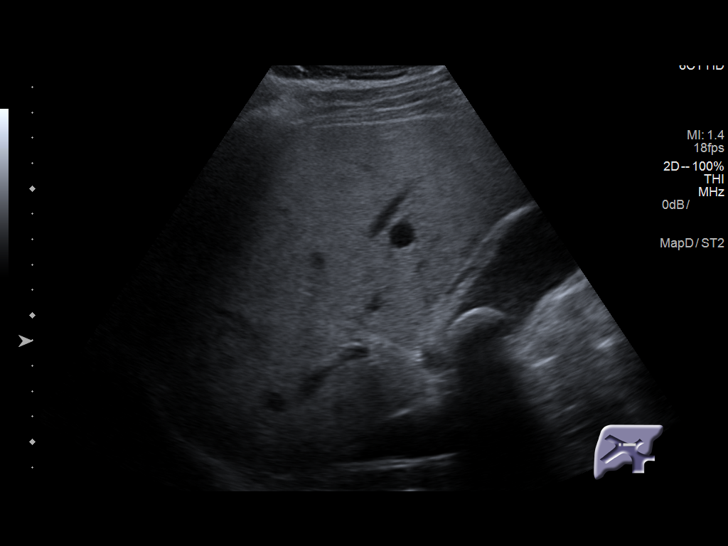
[im 54/54]
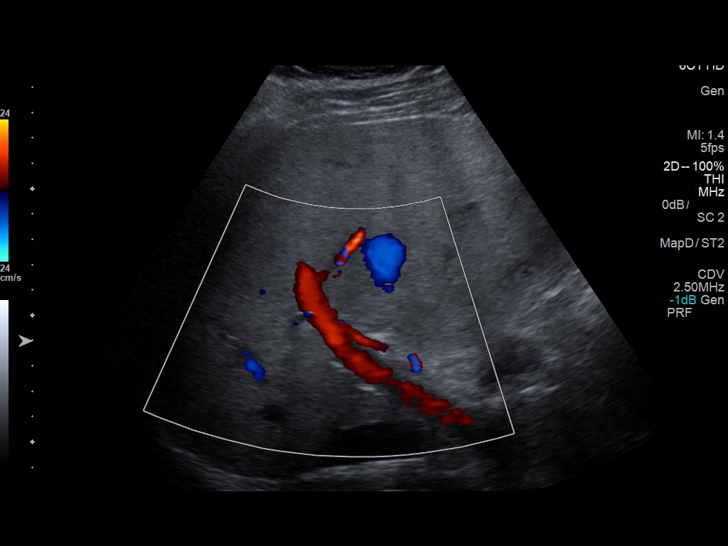

[14 of 25 positions shown; findings below may reference images not displayed]

FINDINGS: Gallbladder:

Gallstones are evident, measuring up to 2.8 cm. There is gallbladder
wall thickening with apparent gallbladder wall edema. Trace amount
of pericholecystic fluid evident. Sonographer reports no sonographic
Murphy sign.

Common bile duct:

Diameter: 4-5 mm

Liver:

Diffusely increased echogenicity suggests fatty deposition. Portal
vein is patent on color Doppler imaging with normal direction of
blood flow towards the liver.
IMPRESSION: 1. Cholelithiasis with gallbladder wall edema/thickening and trace
pericholecystic fluid. The sonographic imaging features are highly
suspicious for acute cholecystitis.
2. No biliary dilatation.

## 2019-10-12 IMAGING — DX CHEST - 2 VIEW
2 series · 2 of 2 positions shown · non-contrast
Comparison: 07/06/2015

CLINICAL DATA: Shortness of breath beginning last night. Diabetes
and hypertension.

EXAM:
CHEST - 2 VIEW

[chest pa]
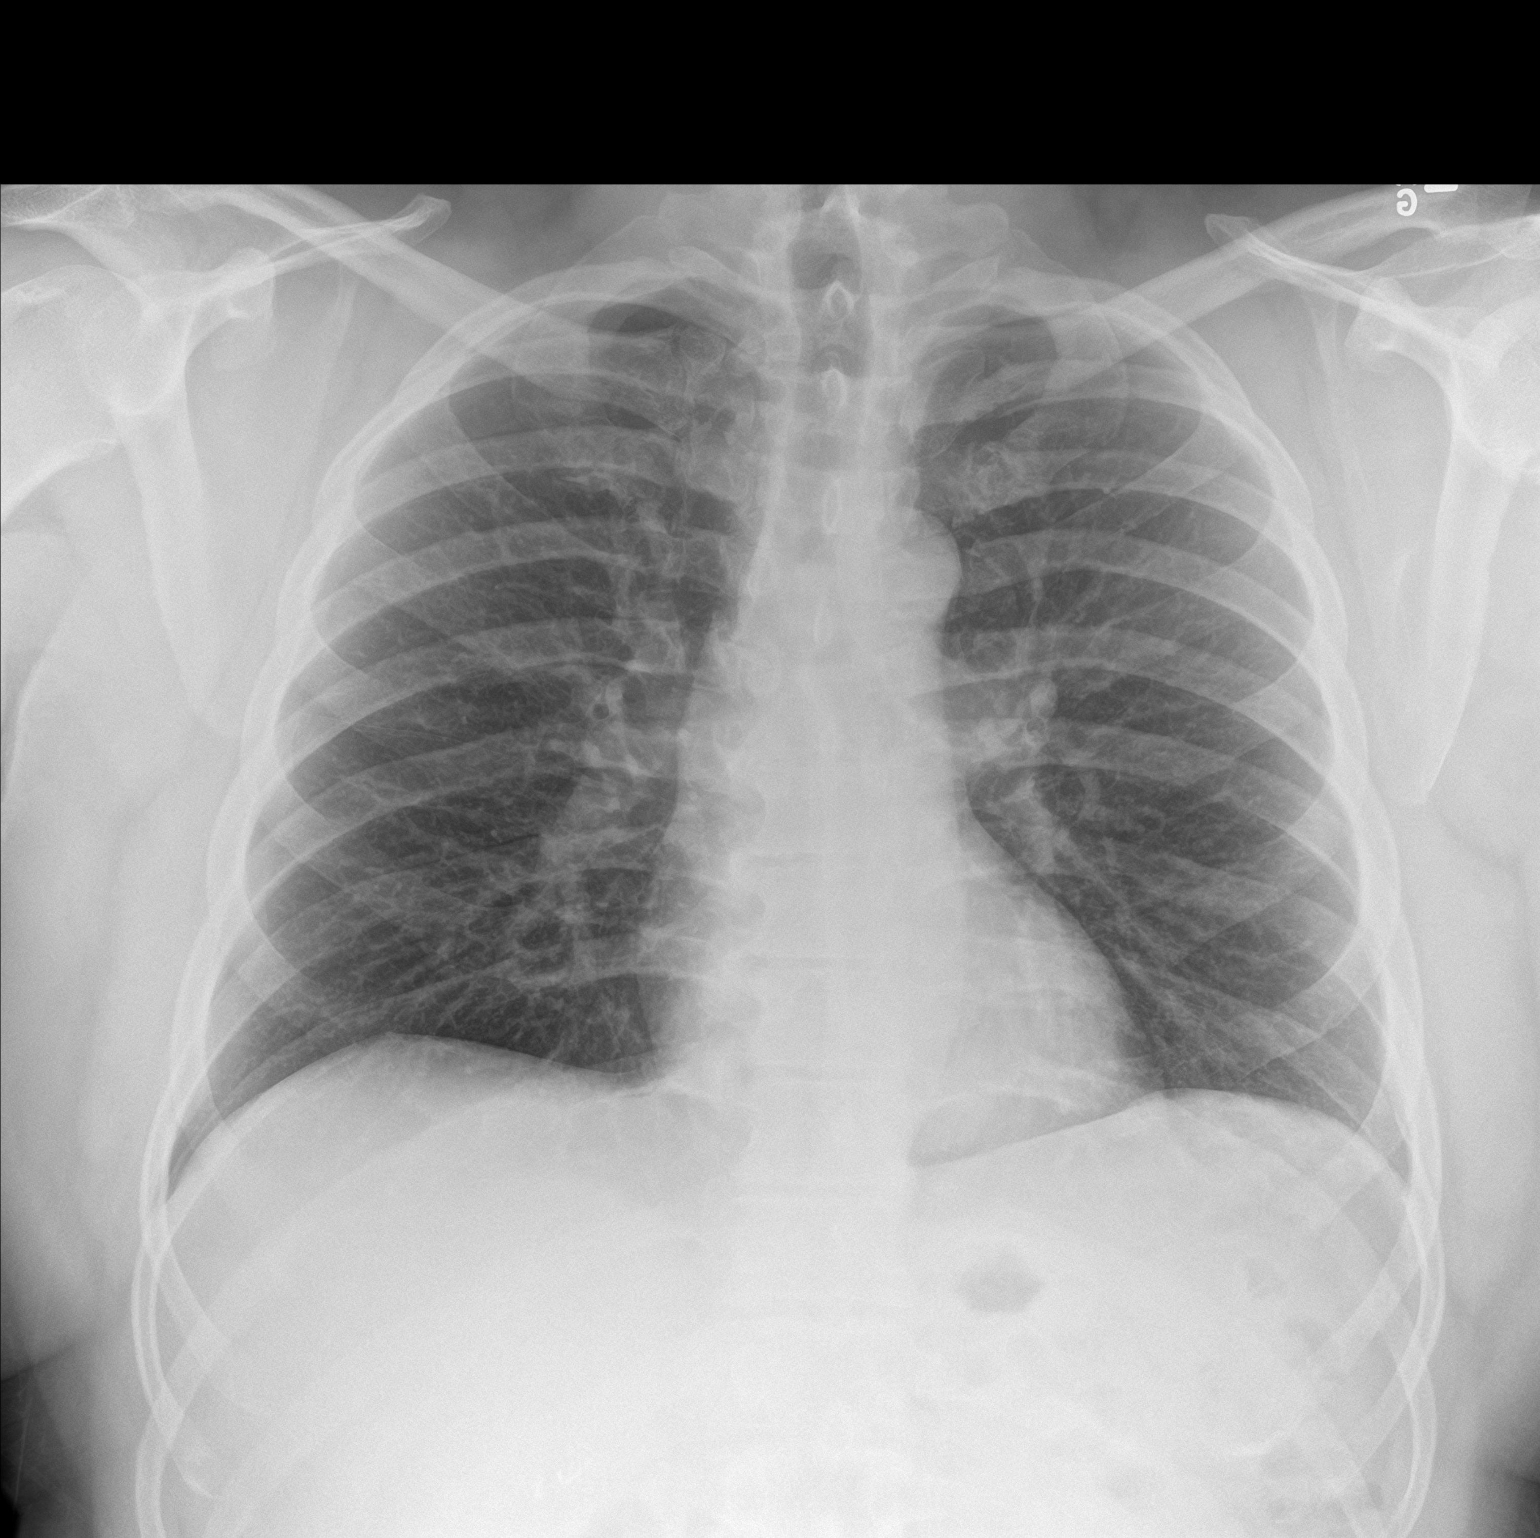

[chest lat]
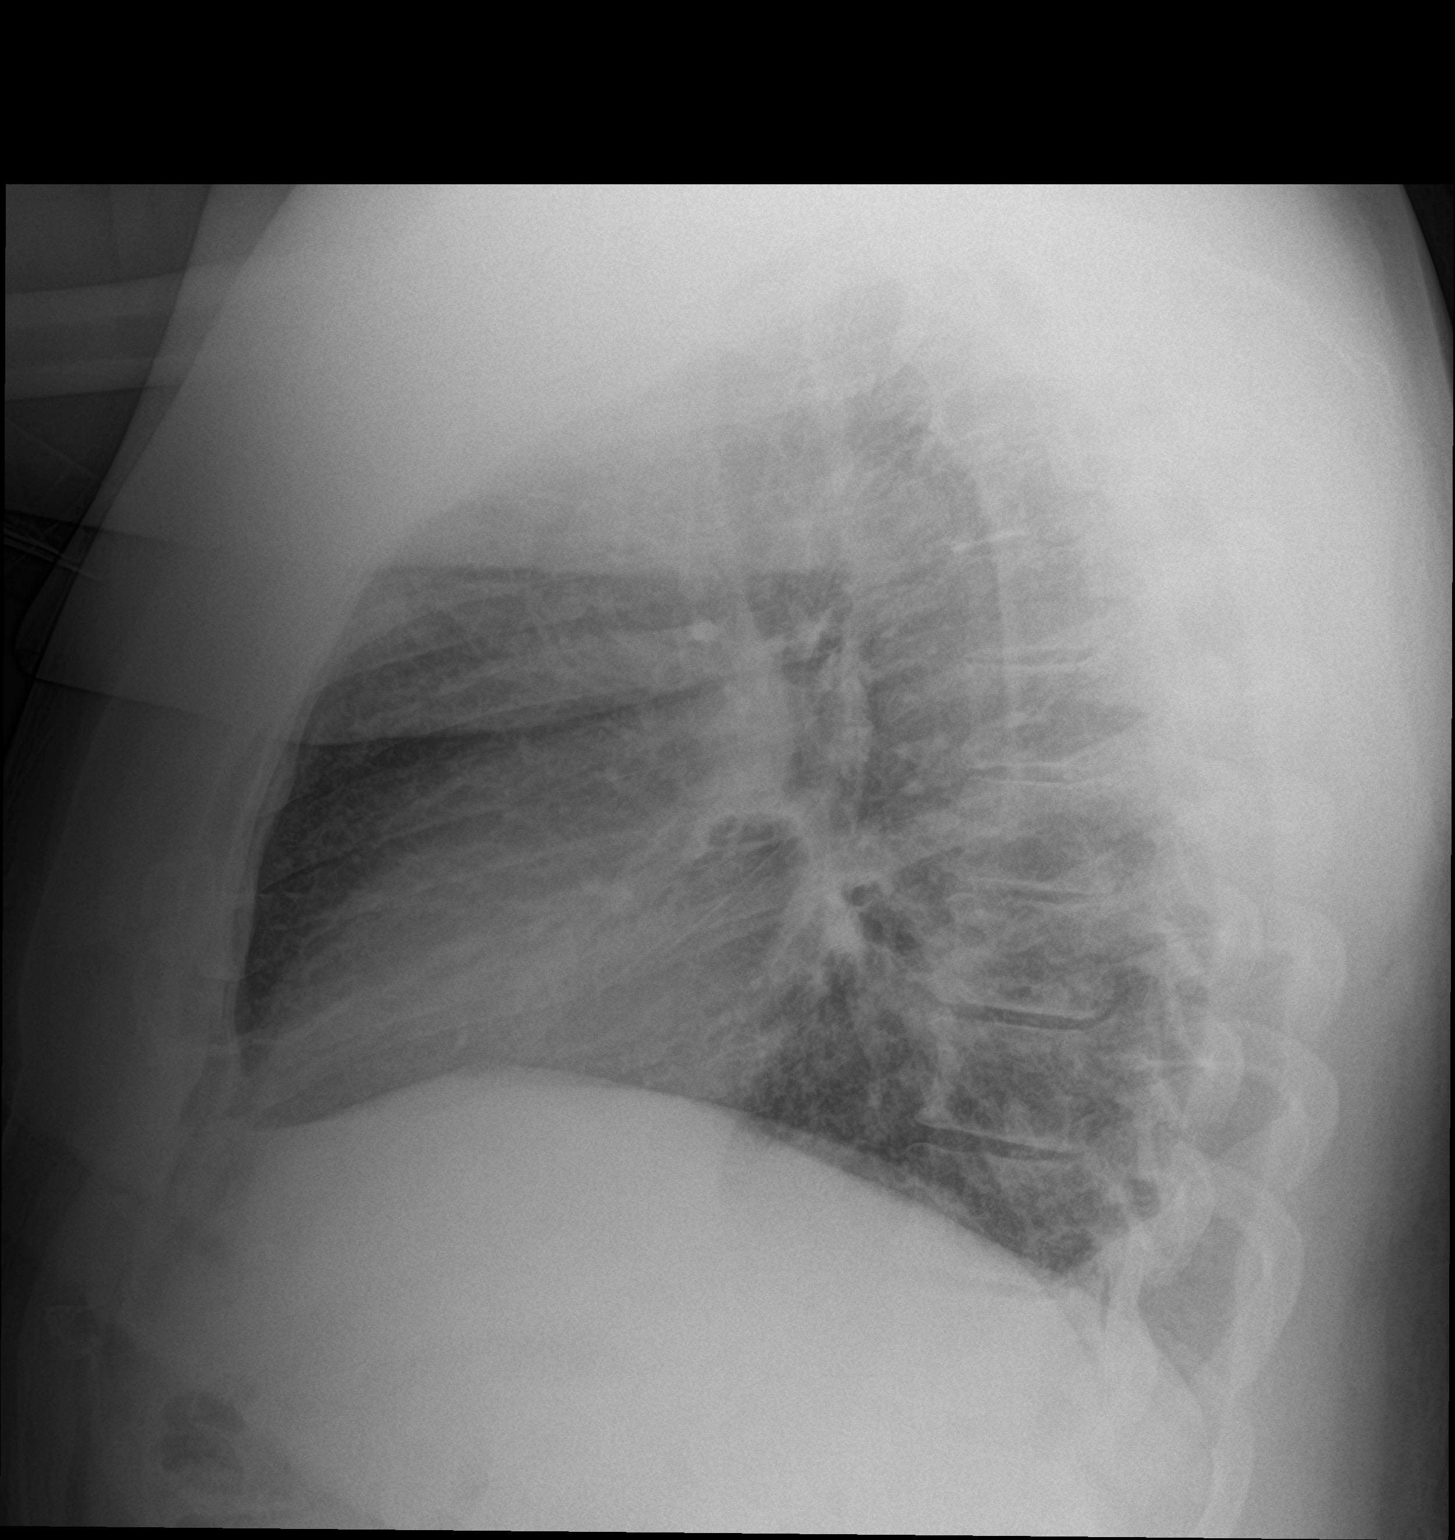

[2 of 2 positions shown; findings below may reference images not displayed]

FINDINGS: Heart size is normal. Mediastinal shadows are normal. The lungs are
clear. No bronchial thickening. No infiltrate, mass, effusion or
collapse. Pulmonary vascularity is normal. No bony abnormality.
IMPRESSION: Normal chest

## 2020-09-26 ENCOUNTER — Ambulatory Visit
Admission: EM | Admit: 2020-09-26 | Discharge: 2020-09-26 | Disposition: A | Discharge status: Home / Self Care | Payer: BC Managed Care – PPO | Attending: Family Medicine | Admitting: Family Medicine

## 2020-09-26 ENCOUNTER — Encounter: Payer: Self-pay | Admitting: Emergency Medicine

## 2020-09-26 ENCOUNTER — Other Ambulatory Visit: Payer: Self-pay

## 2020-09-26 DIAGNOSIS — U071 COVID-19: Secondary | ICD-10-CM

## 2020-09-26 HISTORY — DX: Gout, unspecified: M10.9

## 2020-09-26 LAB — BASIC METABOLIC PANEL
BUN/Creatinine Ratio: 5 — ABNORMAL LOW (ref 9–20)
BUN: 5 mg/dL — ABNORMAL LOW (ref 6–24)
CO2: 22 mmol/L (ref 20–29)
Calcium: 9.3 mg/dL (ref 8.7–10.2)
Chloride: 101 mmol/L (ref 96–106)
Creatinine, Ser: 1.06 mg/dL (ref 0.76–1.27)
Glucose: 119 mg/dL — ABNORMAL HIGH (ref 65–99)
Potassium: 3.8 mmol/L (ref 3.5–5.2)
Sodium: 136 mmol/L (ref 134–144)
eGFR: 85 mL/min/{1.73_m2} (ref >59)

## 2020-09-26 MED ORDER — ALBUTEROL SULFATE HFA 108 (90 BASE) MCG/ACT IN AERS
2.0000 | INHALATION_SPRAY | Freq: Once | RESPIRATORY_TRACT | Status: AC
  Administered 2020-09-26: 10:00:00 2 via RESPIRATORY_TRACT

## 2020-09-26 MED ORDER — PAXLOVID 10 X 150 MG & 10 X 100MG PO TBPK: 2.0000 | ORAL_TABLET | Freq: Two times a day (BID) | ORAL | 0 refills | Status: DC

## 2020-09-26 NOTE — ED Triage Notes (Signed)
Cough, body aches, dizziness, sore throat since yesterday morning. Positive home covid test yesterday.

## 2020-09-26 NOTE — ED Provider Notes (Signed)
[facility]    CSN: [id number] Arrival date & time: 09/26/20  0849      History   Chief Complaint No chief complaint on file.   HPI Marcus Mathis is a 50 y.o. male.   HPI Patient presents today with symptoms of fever, generalized body aches, fever and chills.  He took two home COVID test both resulted as positive.  Patient is high risk for COVID complications related to DM2, Hypertension , obesity, and OSA. He denies SOB. Reports body aches and cough most worrisome symptoms. He has been febrile 1 day. Symptoms started x 1 day.  Past Medical History:  Diagnosis Date   Diabetes mellitus without complication (HCC)    Gout    Hypertension    Sleep apnea     Patient Active Problem List   Diagnosis Date Noted   Acute calculous cholecystitis 01/09/2017   Hyponatremia 01/09/2017   Hyperglycemia 01/09/2017   Hypertension 01/09/2017   Cholelithiasis 01/09/2017   Leukocytosis 01/09/2017   Dehydration 01/09/2017   Hyperbilirubinemia 01/09/2017   Gout 01/09/2017    Past Surgical History:  Procedure Laterality Date   CHOLECYSTECTOMY N/A 01/10/2017   Procedure: LAPAROSCOPIC CHOLECYSTECTOMY;  Surgeon: Franky Macho, MD;  Location: [facility];  Service: General;  Laterality: N/A;       Home Medications    Prior to Admission medications   Medication Sig Start Date End Date Taking? Authorizing Provider  amLODipine-olmesartan (AZOR) 10-40 MG tablet Take 1 tablet by mouth daily. 06/19/15   [provider]  cetirizine (ZYRTEC) 10 MG tablet Take 10 mg by mouth daily.    [provider]  colchicine 0.6 MG tablet Take 0.6 mg by mouth daily.    [provider]  oxyCODONE-acetaminophen (PERCOCET/ROXICET) 5-325 MG tablet Take 1 tablet by mouth every 4 (four) hours as needed for moderate pain. 01/13/17   Franky Macho, MD    Family History No family history on file.  Social History Social History   Tobacco Use   Smoking status: Never   Smokeless  tobacco: Never  Substance Use Topics   Alcohol use: No   Drug use: No     Allergies   Patient has no known allergies.   Review of Systems Review of Systems Pertinent negatives listed in HPI   Physical Exam Triage Vital Signs ED Triage Vitals [09/26/20 0857]  Enc Vitals Group     BP 130/84     Pulse Rate 96     Resp 19     Temp 100.1 F (37.8 C)     Temp Source Oral     SpO2 92 %     Weight      Height      Head Circumference      Peak Flow      Pain Score 5     Pain Loc      Pain Edu?      Excl. in GC?    No data found.  Updated Vital Signs BP 130/84 (BP Location: Right Arm)   Pulse 96   Temp 100.1 F (37.8 C) (Oral)   Resp 19   SpO2 92%   Visual Acuity Right Eye Distance:   Left Eye Distance:   Bilateral Distance:    Right Eye Near:   Left Eye Near:    Bilateral Near:     Physical Exam Constitutional:      Appearance: He is obese.  Cardiovascular:     Rate and Rhythm: Normal rate  and regular rhythm.  Pulmonary:     Effort: Pulmonary effort is normal. No accessory muscle usage.     Breath sounds: Decreased breath sounds present. No wheezing.  Neurological:     Mental Status: He is alert and oriented to person, place, and time.     GCS: GCS eye subscore is 4. GCS verbal subscore is 5. GCS motor subscore is 6.  Psychiatric:        Attention and Perception: Attention normal.        Mood and Affect: Mood normal.        Speech: Speech normal.    UC Treatments / Results  Labs (all labs ordered are listed, but only abnormal results are displayed) Labs Reviewed - No data to display  EKG   Radiology No results found.  Procedures Procedures (including critical care time)  Medications Ordered in UC Medications - No data to display  Initial Impression / Assessment and Plan / UC Course  I have reviewed the triage vital signs and the nursing notes.  Pertinent labs & imaging results that were available during my care of the patient were  reviewed by me and considered in my medical decision making (see chart for details).     Patient is a candidate for pack Paxlovid  however no recent BMP on file.  Collecting a BMP to evaluate kidney functioning.  Upon receipt of kidney function we will fax Pepcid prescription over to pharmacy. Albuterol inhaler dispensed here in clinic today patient advised to use for any development of shortness of breath or chest tightness.  Strict ER precautions given if any of his symptoms clearly worsen.  Tylenol for management of fever.  OTC management of cough or any URI symptoms.  Patient verbalized understanding agreement plan peer Final Clinical Impressions(s) / UC Diagnoses   Final diagnoses:  COVID-19 virus infection     Discharge Instructions      Once I receive your lab results back I will send over Paxlovid to the pharmacy this is the antiviral treatment for COVID-19.  Someone from our office will notify you once the prescription has been sent.   Recommendation management of viral illness include: Vitamin D 5,000 IU daily Vitamin C 500 mg twice daily Zinc 50 mg daily   If you develop any shortness of breath start use of albuterol inhaler 2 puffs every 4-6 hours as needed.   If you have any severe shortness of breath or chest pain  go immediately to the emergency department.    ED Prescriptions     Medication Sig Dispense Auth. Provider   nirmatrelvir/ritonavir EUA, renal dosing, (PAXLOVID) TBPK Take 2 tablets by mouth 2 (two) times daily for 5 days. Patient GFR is    Take nirmatrelvir (150 mg) one tablet twice daily for 5 days and ritonavir (100 mg) one tablet twice daily for 5 days. 20 tablet Bing Neighbors, FNP      PDMP not reviewed this encounter.   Bing Neighbors, [place] 09/26/20 [phone]

## 2020-09-26 NOTE — Discharge Instructions (Signed)
Once I receive your lab results back I will send over Paxlovid to the pharmacy this is the antiviral treatment for COVID-19.  Someone from our office will notify you once the prescription has been sent.   Recommendation management of viral illness include: Vitamin D 5,000 IU daily Vitamin C 500 mg twice daily Zinc 50 mg daily   If you develop any shortness of breath start use of albuterol inhaler 2 puffs every 4-6 hours as needed.   If you have any severe shortness of breath or chest pain  go immediately to the emergency department.

## 2020-09-27 ENCOUNTER — Telehealth: Payer: Self-pay | Admitting: Family Medicine

## 2020-09-27 MED ORDER — PAXLOVID 10 X 150 MG & 10 X 100MG PO TBPK: 2.0000 | ORAL_TABLET | Freq: Two times a day (BID) | ORAL | 0 refills | Status: AC

## 2020-09-27 NOTE — Telephone Encounter (Signed)
Paxlovid e-prescribed to pharmacy.  GFR 85, BMP resulted
